# Patient Record
Sex: Female | Born: 1950 | Race: White | Hispanic: No | Marital: Single | Smoking: Never smoker
Health system: Southern US, Community
[De-identification: ages and names within clinical notes are randomized; demographics above are authoritative.]

## PROBLEM LIST (undated history)

## (undated) DIAGNOSIS — H539 Unspecified visual disturbance: Secondary | ICD-10-CM

## (undated) DIAGNOSIS — K219 Gastro-esophageal reflux disease without esophagitis: Secondary | ICD-10-CM

## (undated) DIAGNOSIS — E119 Type 2 diabetes mellitus without complications: Secondary | ICD-10-CM

## (undated) HISTORY — DX: Gastro-esophageal reflux disease without esophagitis: K21.9

## (undated) HISTORY — DX: Type 2 diabetes mellitus without complications: E11.9

## (undated) HISTORY — DX: Unspecified visual disturbance: H53.9

---

## 1994-11-04 ENCOUNTER — Ambulatory Visit: Admit: 1994-11-04 | Disposition: A | Discharge status: Home / Self Care | Payer: Self-pay | Source: Ambulatory Visit

## 1999-08-15 ENCOUNTER — Ambulatory Visit
Admit: 1999-08-15 | Disposition: A | Discharge status: Home / Self Care | Payer: Self-pay | Source: Ambulatory Visit | Admitting: Obstetrics & Gynecology

## 1999-10-22 ENCOUNTER — Ambulatory Visit: Admission: RE | Admit: 1999-10-22 | Payer: Self-pay | Source: Ambulatory Visit | Admitting: Gastroenterology

## 2001-04-08 HISTORY — PX: KNEE SURGERY: SHX244

## 2001-10-08 ENCOUNTER — Ambulatory Visit
Admit: 2001-10-08 | Disposition: A | Discharge status: Home / Self Care | Payer: Self-pay | Source: Ambulatory Visit | Admitting: "Endocrinology

## 2002-01-11 ENCOUNTER — Ambulatory Visit
Admit: 2002-01-11 | Disposition: A | Discharge status: Home / Self Care | Payer: Self-pay | Source: Ambulatory Visit | Admitting: "Endocrinology

## 2012-04-03 ENCOUNTER — Other Ambulatory Visit: Payer: Self-pay

## 2012-04-04 ENCOUNTER — Ambulatory Visit: Payer: Commercial Managed Care - POS

## 2012-04-04 DIAGNOSIS — S99919A Unspecified injury of unspecified ankle, initial encounter: Secondary | ICD-10-CM | POA: Insufficient documentation

## 2012-04-04 DIAGNOSIS — S8990XA Unspecified injury of unspecified lower leg, initial encounter: Secondary | ICD-10-CM | POA: Insufficient documentation

## 2012-04-04 DIAGNOSIS — M25569 Pain in unspecified knee: Secondary | ICD-10-CM | POA: Insufficient documentation

## 2012-04-04 DIAGNOSIS — M25561 Pain in right knee: Secondary | ICD-10-CM

## 2012-04-04 DIAGNOSIS — M25469 Effusion, unspecified knee: Secondary | ICD-10-CM | POA: Insufficient documentation

## 2012-04-04 DIAGNOSIS — M2240 Chondromalacia patellae, unspecified knee: Secondary | ICD-10-CM | POA: Insufficient documentation

## 2012-04-04 DIAGNOSIS — W19XXXA Unspecified fall, initial encounter: Secondary | ICD-10-CM | POA: Insufficient documentation

## 2014-10-05 ENCOUNTER — Encounter (INDEPENDENT_AMBULATORY_CARE_PROVIDER_SITE_OTHER): Payer: Self-pay | Admitting: Neurology

## 2014-10-05 ENCOUNTER — Ambulatory Visit (INDEPENDENT_AMBULATORY_CARE_PROVIDER_SITE_OTHER): Payer: Commercial Managed Care - POS | Admitting: Neurology

## 2014-10-05 ENCOUNTER — Ambulatory Visit (INDEPENDENT_AMBULATORY_CARE_PROVIDER_SITE_OTHER): Payer: BLUE CROSS/BLUE SHIELD | Admitting: Neurology

## 2014-10-05 VITALS — BP 135/83 | HR 101 | Ht 63.5 in | Wt 143.0 lb

## 2014-10-05 DIAGNOSIS — S060X0A Concussion without loss of consciousness, initial encounter: Secondary | ICD-10-CM

## 2014-10-05 DIAGNOSIS — G44309 Post-traumatic headache, unspecified, not intractable: Secondary | ICD-10-CM

## 2014-10-05 DIAGNOSIS — G4700 Insomnia, unspecified: Secondary | ICD-10-CM

## 2014-10-05 MED ORDER — AMITRIPTYLINE HCL 10 MG PO TABS
20.0000 mg | ORAL_TABLET | Freq: Every evening | ORAL | Status: DC
Start: 2014-10-05 — End: 2014-12-13

## 2014-10-05 NOTE — Progress Notes (Signed)
Subjective:   Chief Complaint: Concussion     HPI:  Ruth [name] is a left handed 64 y.o. female with history of diabetes, mild diabetic neuropathy in feet, HTN, anxiety/depression on effexor for a couple years, and insomnia, who presents as a new patient for concussion.     About a month ago, late at night, she was trying to change a light bulb when she lost her balance and fell and landed on her right knee and hit her head on the dresser. This resulted in a laceration to her forehead which bled. She cleaned up her forehead and didn't think too much of her fall, so she went to sleep afterwards.     The next day she went to an urgent care after she started to develop dizziness, blurry vision, photophobia, and photophobia. She had her knee xrayed, which was reportedly normal. No imaging of her head or neck was done.     For 5 days, the above symptoms were very prominent. She couldn't sleep at all for 3 days/nights as well. She also felt that her mood was affected, possible depressive mood. She also endorsed 3-4 days of intention tremor.     Many of these symptoms improved, but she continues to have a constant headache, holocephalic. Describes her headache as a pressure sensation, but it is not very painful. 6/10. She also has intermittent pains on either side of her head which are more sharp in nature. Sometimes these sharp pains last a few minutes. No associated nausea. She has less photophobia these days. No neck pain. No associated focal numbness/tingling.  No major history of headaches in the past. No history of concussion previously. She still has blurry vision. She feels her distance vision is a little blurry.  No focal weakness. Dizziness is almost gone.    She has been taking aleve every other day for headaches.    She has had insomnia for 12 years. Sleep study in past reportedly normal. Tried OTC medications. She has been keeping good sleep hygiene. Reducing alcohol and caffeine intake. Tried lunesta  in the past. Sometimes helped. Sleepy during the day.     In other ROS, she also reports that her heart rate can be as high as 126 times, but she has no symptoms associated with it.      Review of Systems   Eyes: Positive for blurred vision and photophobia.   Musculoskeletal: Positive for joint pain.   Neurological: Positive for dizziness and headaches.   Psychiatric/Behavioral: Positive for depression. The patient is nervous/anxious.    All other systems reviewed and are negative.      The following portions of the patient's history were reviewed and updated as appropriate: allergies, current medications, past family history, past medical history, past social history, past surgical history and problem list.       Allergies   Allergen Reactions   . Codeine    . Ciprofloxacin Rash       Current Outpatient Prescriptions   Medication Sig Dispense Refill   . Dulaglutide (TRULICITY SC) Inject into the skin once a week.     Marland Kitchen glimepiride (AMARYL) 4 MG tablet Take 4 mg by mouth 2 (two) times daily.     . Insulin Degludec 100 UNIT/ML Solution Pen-injector Inject into the skin daily.     Marland Kitchen losartan (COZAAR) 100 MG tablet Take 100 mg by mouth daily.     . metFORMIN (GLUCOPHAGE) 1000 MG tablet Take 1,000 mg by mouth 2 (two)  times daily with meals.     . venlafaxine (EFFEXOR) 75 MG tablet Take 250 mg by mouth daily.     Marland Kitchen amitriptyline (ELAVIL) 10 MG tablet Take 2 tablets (20 mg total) by mouth nightly. 60 tablet 2     No current facility-administered medications for this visit.       There is no problem list on file for this patient.      History   Substance Use Topics   . Smoking status: Never Smoker    . Smokeless tobacco: Never Used   . Alcohol Use: Not on file       No family history on file.    No past surgical history on file.    Objective:   GENERAL EXAM:  Filed Vitals:    10/05/14 1245   BP: 135/83   Pulse: 101      Physical Exam   Constitutional: She is well-developed, well-nourished, and in no distress.   HENT:    Head: Normocephalic and atraumatic.   Eyes: Conjunctivae and EOM are normal. Pupils are equal, round, and reactive to light.   Neck: Normal range of motion. Neck supple.   Cardiovascular: Normal rate, regular rhythm and normal heart sounds.    Vitals reviewed.    I examined the fundi and found no abnormality. I heard no carotid bruits.     NEUROLOGIC EXAM:     Mental Status: The patient is awake, alert and oriented to person, [place], and time. Affect is normal  Fund of knowledge appropriate  3/3 immediate recall intact. 2/3 delayed recall.   Attention span and concentration appear normal. WORLD backwards intact.   No dysarthria. Naming intact, repetition intact, fluent language.    Cranial nerves:   -CN II: Visual fields full to bedside confrontation   -CN III, IV, VI: Pupils equal, round, and reactive to light; extraocular movements intact; no ptosis   -CN V: Facial sensation intact in V1 through V3 distributions   -CN VII: Face symmetric  -CN VIII: Hearing intact to conversational speech   -CN IX, X: Palate elevates symmetrically; normal phonation   -CN XI: Symmetric full strength of sternocleidomastoid and trapezius muscles   -CN XII: Tongue protrudes midline    Motor: Muscle tone normal. No atrophy, No fasiculations.   Strength R / L      R / L  Deltoid  5 / 5   Hip Flexion        5 / 5  Triceps  5 / 5   Knee flexion      5 / 5  Biceps  5 / 5   Knee ext          5 / 5  Wrist ext  5 / 5  Dorsiflexion     5 / 5   Wrist flexion  5 / 5  Plantar flexion  5 / 5   FF   5 / 5       Sensory:   Light touch intact  Temperature intact  Neglect absent    Reflexes: R / L     R / L  Biceps  2 / 2   Patellar 2 / 2  Triceps  2 / 2   Ankles  2 / 2  BR                   2 / 2   Babinksi absent    Coordination: No ataxia. No tremors.  Gait and Romberg are normal. The patient is able to stand on heels and toes without difficulty. Some difficulty with tandem.     ------    LABS:  None to review    ------    IMAGES:  None to  review    Assessment:      1. Fall  2. Head injury  3. Mild concussion  4. Insomnia  5. Headaches    Ms. Orser is a 64 year old woman with history of diabetes, mild diabetic neuropathy in feet, HTN, anxiety/depression on effexor for a couple years, and insomnia who presents as a new patient for fall 1 month ago, resulting in head injury.  She had a mechanical fall resulting in what is likely a mild concussion and postconcussive headache.  She has no significant history of headaches.  Due to the constant nature of her headaches with some associated imbalance, I would recommend neuroimaging at this time to rule out any intracranial hemorrhage that she might of had at the time of her fall or any other structural abnormalities, which may be contributing to her ongoing headaches.    Plan:     1. Concussion, without loss of consciousness, initial encounter  MRI Brain W WO Contrast    If she continues to have imbalance, consider vestibular therapy in the future.     2. Post-traumatic headache, not intractable, unspecified chronicity pattern  amitriptyline (ELAVIL) 10 MG tablet QHS. Titrate up to 20 mg as tolerated.    MRI Brain W WO Contrast   3. Insomnia, unspecified type  Will address further at her next appointment. Consider repeat sleep study and MLST. Amitriptyline may help with sleep.  Continue to maintain good sleep hygiene.       Follow up: 4 weeks and as needed.    Nikki Dom, MD  [facility]  [facility]  T [phone] F [phone]

## 2014-10-05 NOTE — Patient Instructions (Signed)
1. Start amitriptyline 10 mg every night for your headaches. If you have no side effects after one week and no improvement in your headaches, increase the dose to 20 mg every night.   2. Please schedule your MRI brain.

## 2014-10-06 ENCOUNTER — Encounter (INDEPENDENT_AMBULATORY_CARE_PROVIDER_SITE_OTHER): Payer: Self-pay

## 2014-10-18 ENCOUNTER — Encounter (INDEPENDENT_AMBULATORY_CARE_PROVIDER_SITE_OTHER): Payer: Self-pay | Admitting: Neurology

## 2014-10-24 ENCOUNTER — Encounter (INDEPENDENT_AMBULATORY_CARE_PROVIDER_SITE_OTHER): Payer: Self-pay | Admitting: Neurology

## 2014-10-24 ENCOUNTER — Ambulatory Visit (INDEPENDENT_AMBULATORY_CARE_PROVIDER_SITE_OTHER): Payer: BLUE CROSS/BLUE SHIELD | Admitting: Neurology

## 2014-10-24 VITALS — BP 114/76 | HR 105 | Ht 63.5 in | Wt 143.0 lb

## 2014-10-24 DIAGNOSIS — G4700 Insomnia, unspecified: Secondary | ICD-10-CM

## 2014-10-24 DIAGNOSIS — D329 Benign neoplasm of meninges, unspecified: Secondary | ICD-10-CM

## 2014-10-24 DIAGNOSIS — G44309 Post-traumatic headache, unspecified, not intractable: Secondary | ICD-10-CM

## 2014-10-24 DIAGNOSIS — S060X0D Concussion without loss of consciousness, subsequent encounter: Secondary | ICD-10-CM

## 2014-10-24 NOTE — Patient Instructions (Signed)
1. Please follow up with your primary doctor to consider adjusting your blood pressure medication to see if this is contributing to your dizziness. Possible orthostatic hypotension.

## 2014-10-24 NOTE — Progress Notes (Signed)
Subjective:   Chief Complaint: Follow up, last seen 6/29 for concussion.    HPI:  Ruth [name] is a 64 y.o. female with history of diabetes, mild diabetic neuropathy in feet, HTN, anxiety/depression on effexor for a couple years, and insomnia, who presents as a follow up for concussion.     She has been home for the past few weeks resting. Plans to start a new job in August.     Since her last visit, she had a MRI of her brain which showed no acute findings.  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  It also showed an incidental small focus of extra-axial enhancement in the right frontal region suspicious for a small meningioma.    She started amitriptyline at a low dose for her headaches. 10 mg has helped her sleep, but she continues to have a dull constant headaches. However, her headache is less severe. Worse at the end of the day or worse after reading or when she is at the computer for a period of time. 3/10 in severity. Sleep is better. Sleeps 6 hours a night as opposed to 3 hours previously. 20 mg QHS made her too groggy. Mood unchanged.     Dizziness is not changed. Provoked by changes in position mainly. No associated nausea or other focal neurological symptoms. She states that she had some dizziness even before her head injury which was also positional, but it was less severe. She thinks she started to have dizziness with starting a specific blood pressure medication in the past. Her blood pressures have also been much lower than previously. She states that she used to live in the 170s systolic, but now she is in the 110's all the time.     She denies any new neurological symptoms today.    To review her initial presentation on 6/29: About a month ago, late at night, she was trying to change a light bulb when she lost her balance and fell and landed on her right knee and hit her head on the dresser. This resulted in a laceration to her forehead  which bled. She cleaned up her forehead and didn't think too much of her fall, so she went to sleep afterwards.     The next day she went to an urgent care after she started to develop dizziness, blurry vision, photophobia, and photophobia. She had her knee xrayed, which was reportedly normal. No imaging of her head or neck was done.     For 5 days, the above symptoms were very prominent. She couldn't sleep at all for 3 days/nights as well. She also felt that her mood was affected, possible depressive mood. She also endorsed 3-4 days of intention tremor.     Many of these symptoms improved, but she continues to have a constant headache, holocephalic. Describes her headache as a pressure sensation, holocephalic, but it is not very painful. 6/10. She also has intermittent pains on either side of her head which are more sharp in nature. Sometimes these sharp pains last a few minutes. No associated nausea. She has less photophobia these days. No neck pain. No associated focal numbness/tingling. No major history of headaches in the past. No history of concussion previously. She still has blurry vision. She feels her distance vision is a little blurry.  No focal weakness. Dizziness is almost gone.    She has been taking aleve every other day for headaches.    She has had insomnia  for 12 years. Sleep study in past reportedly normal. Tried OTC medications. She has been keeping good sleep hygiene. Reducing alcohol and caffeine intake. Tried lunesta in the past. Sometimes helped. Sleepy during the day. Sleep study done in the past reportedly was normal.    In other ROS, she also reports that her heart rate can be as high as 126 times, but she has no symptoms associated with it.    Review of Systems   Neurological: Positive for dizziness and headaches.   Psychiatric/Behavioral: The patient has insomnia.    All other systems reviewed and are negative.      The following portions of the patient's history were reviewed and  updated as appropriate: allergies, current medications, past family history, past medical history, past social history, past surgical history and problem list.       Allergies   Allergen Reactions   . Codeine    . Ciprofloxacin Rash       Current Outpatient Prescriptions   Medication Sig Dispense Refill   . amitriptyline (ELAVIL) 10 MG tablet Take 2 tablets (20 mg total) by mouth nightly. 60 tablet 2   . Dulaglutide (TRULICITY SC) Inject into the skin once a week.     Marland Kitchen glimepiride (AMARYL) 4 MG tablet Take 4 mg by mouth 2 (two) times daily.     . Insulin Degludec 100 UNIT/ML Solution Pen-injector Inject into the skin daily.     Marland Kitchen losartan (COZAAR) 100 MG tablet Take 100 mg by mouth daily.     . metFORMIN (GLUCOPHAGE) 1000 MG tablet Take 1,000 mg by mouth 2 (two) times daily with meals.     . venlafaxine (EFFEXOR) 75 MG tablet Take 250 mg by mouth daily.       No current facility-administered medications for this visit.       There is no problem list on file for this patient.    Objective:   GENERAL EXAM:  Filed Vitals:    10/24/14 1239   BP: 114/76   Pulse: 105      Physical Exam   Constitutional: She is well-developed, well-nourished, and in no distress.   HENT:   Head: Normocephalic and atraumatic.   Eyes: Conjunctivae and EOM are normal. Pupils are equal, round, and reactive to light.   Vitals reviewed.    NEUROLOGIC EXAM:     Mental Status: The patient is awake, alert Affect is normal  Fund of knowledge appropriate  Attention span and concentration appear normal.  No dysarthria. Language fluent in conversation.    Cranial nerves:   -CN II: Visual fields full to bedside confrontation   -CN III, IV, VI: Pupils equal, round, and reactive to light; extraocular movements intact; no ptosis   -CN V: Facial sensation intact in V1 through V3 distributions   -CN VII: Face symmetric  -CN VIII: Hearing intact to conversational speech     Motor: Muscle tone normal. Full strength throughout.     Sensory:    Light touch intact  Neglect absent    Coordination: No ataxia. No tremors.     Gait and Romberg are normal. Still has some difficulty with tandem, but improved a little.   ------    IMAGES:    MRI brain without contrast October 13, 2014:  Impression:   1. 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    2.  Small focus of extra-axial enhancement in the  right frontal region suspicious for a small meningioma.  Follow-up is recommended.    Assessment:      1. Fall  2. Head injury  3. Mild concussion  4. Insomnia, improved  5. Headaches, mildly improved  6. Dizziness, unchanged    Ms. Holtzclaw is a 64 year old woman with history of diabetes, mild diabetic neuropathy in feet, HTN, anxiety/depression on effexor for a couple years, and insomnia who presents as follow up today after a fall in May, resulting in head injury.  She had a mechanical fall resulting in what is likely a mild concussion and postconcussive headache.  She has no significant history of headaches.  Due to the constant nature of her headaches with some associated imbalance, I recommended neuroimaging which showed no hemorrhage or stroke. It showed an incidental finding of a right frontal meningioma, small in size. Her headaches are mildly improved on low dose amitriptyline. I would like to continue her on this dose due to side effects at higher doses noted. Due to ongoing positional dizziness, I recommend that she start vestibular therapy. She also notes a preceding history of dizziness, which could suggest orthostatic hypotension. I recommend that she follow up with her primary doctor for further evaluation and possible adjustment of her antihypertensive medications.      Plan:     1.  Concussion, without loss of consciousness, initial encounter   Start vestibular therapy for balance exercises     2.  Post-traumatic headache, not intractable, unspecified chronicity pattern    Amitriptyline (ELAVIL) 10 MG tablet QHS.   Consider switching to gabapentin if headaches worsen.   NSAIDS prn.    3.  Insomnia, unspecified type   Consider repeat sleep study and MLST when nortriptyline discontinued. She is currently sleeping better on amitriptyline.  Continue to maintain good sleep hygiene.    4. Incidental meningioma  Repeat MRI brain w wo contrast in 6 months (Jan 2017)                     Follow up: 4 weeks and as needed.    Nikki Dom, MD  [facility]  [facility]  T [phone] F [phone]

## 2014-11-03 ENCOUNTER — Ambulatory Visit (INDEPENDENT_AMBULATORY_CARE_PROVIDER_SITE_OTHER): Payer: BLUE CROSS/BLUE SHIELD | Admitting: Neurology

## 2014-11-18 ENCOUNTER — Encounter (INDEPENDENT_AMBULATORY_CARE_PROVIDER_SITE_OTHER): Payer: Self-pay

## 2014-11-18 NOTE — Progress Notes (Signed)
Pt states she was informed by Dr. Selena Batten to repeat MRI in 6 mos.

## 2014-11-24 ENCOUNTER — Ambulatory Visit (INDEPENDENT_AMBULATORY_CARE_PROVIDER_SITE_OTHER): Payer: BLUE CROSS/BLUE SHIELD | Admitting: Neurology

## 2014-12-13 ENCOUNTER — Ambulatory Visit (INDEPENDENT_AMBULATORY_CARE_PROVIDER_SITE_OTHER): Payer: BLUE CROSS/BLUE SHIELD | Admitting: Neurology

## 2014-12-13 VITALS — BP 152/92 | HR 93 | Ht 62.5 in | Wt 144.8 lb

## 2014-12-13 DIAGNOSIS — G4700 Insomnia, unspecified: Secondary | ICD-10-CM

## 2014-12-13 DIAGNOSIS — D329 Benign neoplasm of meninges, unspecified: Secondary | ICD-10-CM

## 2014-12-13 DIAGNOSIS — R400 Somnolence: Secondary | ICD-10-CM

## 2014-12-13 DIAGNOSIS — G44309 Post-traumatic headache, unspecified, not intractable: Secondary | ICD-10-CM

## 2014-12-13 DIAGNOSIS — S060X0D Concussion without loss of consciousness, subsequent encounter: Secondary | ICD-10-CM

## 2014-12-13 NOTE — Progress Notes (Signed)
Subjective:   Chief Complaint: Follow up, last seen 10/24/14 for concussion    HPI:  Ruth [name] is a left handed 64 y.o. female with history of diabetes, mild diabetic neuropathy in feet, HTN, anxiety/depression on effexor for a couple years, and insomnia, who presents as a follow up for concussion.     At her last visit, she noted a preceding history of dizziness, which could suggest a component of orthostatic hypotension. I recommended that she follow up with her primary doctor for further evaluation and possible adjustment of her antihypertensive medications.  She states that she instead weaned herself off her blood pressure medications herself and feels that her dizziness is almost gone. She reports that her blood pressures had been well controlled at home. However, although her dizziness is almost all gone, she still has trouble keeping her balance. Not any worse than last visit.  I recommended vestibular therapy at her last visit, however, she has not started this yet.    She now notes having random twitching anywhere in her body since her concussion, but more pronounced now. Lasts a second each time. Not all the time. No associated change in mentation. Sometimes in sleep as well.   She also notes that her handwriting is worse, since her concussion.   No cognitive changes. Not as active as before, sleeps for 5 hours a night which is a little better than previously. Still on lunesta.   Headaches are almost gone. None of any significance. Sometimes with barometric changes, she has a mild headache.   She brings in a disc of her MRI.   ....      To review her initial presentation on 6/29: About a month ago, late at night, she was trying to change a light bulb when she lost her balance and fell and landed on her right knee and hit her head on the dresser. This resulted in a laceration to her forehead which bled. She cleaned up her forehead and didn't think too much of her fall, so she went to sleep  afterwards.     The next day she went to an urgent care after she started to develop dizziness, blurry vision, photophobia, and photophobia. She had her knee xrayed, which was reportedly normal. No imaging of her head or neck was done.     For 5 days, the above symptoms were very prominent. She couldn't sleep at all for 3 days/nights as well. She also felt that her mood was affected, possible depressive mood. She also endorsed 3-4 days of intention tremor.     Many of these symptoms improved, but she continues to have a constant headache, holocephalic. Describes her headache as a pressure sensation, holocephalic, but it is not very painful. 6/10. She also has intermittent pains on either side of her head which are more sharp in nature. Sometimes these sharp pains last a few minutes. No associated nausea. She has less photophobia these days. No neck pain. No associated focal numbness/tingling. No major history of headaches in the past. No history of concussion previously. She still has blurry vision. She feels her distance vision is a little blurry.  No focal weakness. Dizziness is almost gone.    She has been taking aleve every other day for headaches.    She has had insomnia for 12 years. Sleep study in past reportedly normal. Tried OTC medications. She has been keeping good sleep hygiene. Reducing alcohol and caffeine intake. Tried lunesta in the past. Sometimes helped. Sleepy  during the day. Sleep study done in the past reportedly was normal.    10/24/14: She has been home for the past few weeks resting. Plans to start a new job in August.   Since her last visit, she had a MRI of her brain which showed no acute findings.  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  It also showed an incidental small focus of extra-axial enhancement in the right frontal region suspicious for a small meningioma.  She started amitriptyline at a low dose for her headaches. 10 mg has  helped her sleep, but she continues to have a dull constant headaches. However, her headache is less severe. Worse at the end of the day or worse after reading or when she is at the computer for a period of time. 3/10 in severity. Sleep is better. Sleeps 6 hours a night as opposed to 3 hours previously. 20 mg QHS made her too groggy. Mood unchanged.   Dizziness is not changed. Provoked by changes in position mainly. No associated nausea or other focal neurological symptoms. She states that she had some dizziness even before her head injury which was also positional, but it was less severe. She thinks she started to have dizziness with starting a specific blood pressure medication in the past. Her blood pressures have also been much lower than previously. She states that she used to live in the 170s systolic, but now she is in the 110's all the time.      Review of Systems   Neurological: Dizziness: imbalance. Tremors: twitching.   Psychiatric/Behavioral: The patient has insomnia.    All other systems reviewed and are negative.      The following portions of the patient's history were reviewed and updated as appropriate: allergies, current medications, past family history, past medical history, past social history, past surgical history and problem list.       Allergies   Allergen Reactions   . Codeine    . Ciprofloxacin Rash       Current Outpatient Prescriptions   Medication Sig Dispense Refill   . Dulaglutide (TRULICITY SC) Inject into the skin once a week.     Marland Kitchen glimepiride (AMARYL) 4 MG tablet Take 4 mg by mouth 2 (two) times daily.     . Insulin Degludec 100 UNIT/ML Solution Pen-injector Inject into the skin daily.     . metFORMIN (GLUCOPHAGE) 1000 MG tablet Take 1,000 mg by mouth 2 (two) times daily with meals.     . venlafaxine (EFFEXOR) 75 MG tablet Take 250 mg by mouth daily.       No current facility-administered medications for this visit.     Objective:   GENERAL EXAM:  Filed Vitals:    12/13/14 1231    BP: 152/92   Pulse: 93      Physical Exam   Constitutional: She is well-developed, well-nourished, and in no distress.   HENT:   Head: Normocephalic and atraumatic.   Eyes: Conjunctivae and EOM are normal. Pupils are equal, round, and reactive to light.   Vitals reviewed.    NEUROLOGIC EXAM:     Mental Status: The patient is awake, alert Affect is normal  Fund of knowledge appropriate  Attention span and concentration appear normal.  No dysarthria. Language fluent in conversation.    Cranial nerves:   -CN II: Visual fields full to bedside confrontation   -CN III, IV, VI: Pupils equal, round, and reactive to light; extraocular movements intact;  no ptosis   -CN V: Facial sensation intact in V1 through V3 distributions   -CN VII: Face symmetric  -CN VIII: Hearing intact to conversational speech     Motor: Muscle tone normal. Full strength throughout.     Sensory:   Light touch intact  Neglect absent    Coordination: No ataxia. No tremors.     Gait and Romberg are normal. Still has some difficulty with tandem, but improved a little.     Assessment:      1. Fall  2. Head injury, May 2016  3. Mild concussion, improving  4. Insomnia, improved but still only sleeping 5 hours a night  5. Headaches, improved  6. Dizziness, improved  7. Imbalance, unchanged  8. Body twitching    Ms. Swofford is a 64 year old woman with history of diabetes, mild diabetic neuropathy in feet, HTN, anxiety/depression on effexor for a couple years, and insomnia who presents as follow up today after a fall in May, resulting in head injury.  She had a mechanical fall resulting in what is likely a mild concussion and postconcussive headache.  She has no significant history of headaches.  Due to the constant nature of her headaches with some associated imbalance, I recommended neuroimaging which showed no hemorrhage or stroke. It showed an incidental finding of a right frontal meningioma, small in size.     Today, she states that her  headaches and dizziness are almost gone. She has persistent imbalance however, as well as random body twitching and worsened handwriting. I do not observe any involuntary movements or fasciculations on her exam today. It is unclear what these are, but they started after her concussion. We went over her MRI today. I do not believe that her small right frontal meningioma could be contributing to her twitching or handwriting difficulty. It may be related to her insomnia and possible sleeping disorder. Symptomatology not consistent with seizures.     Plan:     1. Imbalance Start vestibular therapy for balance exercises      2. Post-traumatic headache, not intractable, unspecified chronicity pattern  Discontinue amitriptyline. Continue NSAIDs PRN.   3. Insomnia, unspecified type  General sleep study    PR MLT SLEEP LATENCY/MAINT OF WAKEFULNESS TSTG   4. Meningioma  Repeat MRI brain w wo contrast in 6 months (Jan 2017)        Follow up: 2 months and as needed.    Nikki Dom, MD  [facility]  [facility]  T [phone] F [phone]

## 2014-12-13 NOTE — Patient Instructions (Addendum)
REMINDERS FROM TODAY'S VISIT:     Tests:  - Please schedule and sleep study and MLST study for your insomnia and daytime sleepiness  - Please schedule a MRI brain to be repeated in January 2017    Medications:  - Discontinue amitriptyline.     Other Instructions:  - Please schedule to start vestibular therapy for your imbalance.   - Please schedule a follow up appointment with me at least one week after your tests are completed in order for [place] to have enough time to receive and review your test results.     HOW TO CONTACT ME:     Please sign up for MyChart. This is an easy way to send me questions or messages online. This is the best way to contact me for non-emergency questions. I try my best to respond to all messages within 48-72 hours.  You can always call our office staff as well if you would like to speak to my nurse Ashten who can also relay messages to me.      However, if you are having a medical emergency, please call 911.

## 2015-01-05 ENCOUNTER — Encounter (INDEPENDENT_AMBULATORY_CARE_PROVIDER_SITE_OTHER): Payer: Self-pay | Admitting: Family

## 2015-01-05 ENCOUNTER — Ambulatory Visit (INDEPENDENT_AMBULATORY_CARE_PROVIDER_SITE_OTHER): Payer: BLUE CROSS/BLUE SHIELD | Admitting: Family

## 2015-01-05 VITALS — BP 157/95 | HR 87 | Temp 97.2°F | Resp 16 | Ht 63.0 in | Wt 142.0 lb

## 2015-01-05 DIAGNOSIS — J0100 Acute maxillary sinusitis, unspecified: Secondary | ICD-10-CM

## 2015-01-05 NOTE — Progress Notes (Signed)
Chief Complaint   Patient presents with   . Sinus Problem     patient has sinus infection. it has been on since 2 months.   no recent travel  No fever  Sinus congestion  No known sick contacts   No allergies   pcp went concierge so pt hasnt been able to find new dr yet   Using otc meds with some relief      Denies headache   Denies dizziness     HEENT WNL  Lungs - CTA  Cards- s1s2 no murmurs   Lymph - no adenopathy     Due to length time symptoms have persisted   Will start on abx   Written rx provided   List of pcp provided       See scanned forms

## 2015-01-07 ENCOUNTER — Encounter (INDEPENDENT_AMBULATORY_CARE_PROVIDER_SITE_OTHER): Payer: Self-pay | Admitting: Family

## 2015-01-24 ENCOUNTER — Other Ambulatory Visit (INDEPENDENT_AMBULATORY_CARE_PROVIDER_SITE_OTHER): Payer: Self-pay | Admitting: Neurology

## 2015-04-01 ENCOUNTER — Other Ambulatory Visit: Payer: Self-pay | Admitting: Neurology

## 2015-04-05 ENCOUNTER — Encounter (INDEPENDENT_AMBULATORY_CARE_PROVIDER_SITE_OTHER): Payer: Self-pay | Admitting: Neurology

## 2015-04-12 ENCOUNTER — Ambulatory Visit (INDEPENDENT_AMBULATORY_CARE_PROVIDER_SITE_OTHER): Payer: BLUE CROSS/BLUE SHIELD | Admitting: Neurology

## 2015-04-28 ENCOUNTER — Encounter (INDEPENDENT_AMBULATORY_CARE_PROVIDER_SITE_OTHER): Payer: Self-pay

## 2015-04-28 NOTE — Progress Notes (Signed)
No results of sleep study on file, called patient to see if she had it done but there was no answer. Left voicemail and callback number.

## 2015-05-02 ENCOUNTER — Ambulatory Visit (INDEPENDENT_AMBULATORY_CARE_PROVIDER_SITE_OTHER): Payer: BLUE CROSS/BLUE SHIELD | Admitting: Neurology

## 2015-05-03 ENCOUNTER — Encounter (INDEPENDENT_AMBULATORY_CARE_PROVIDER_SITE_OTHER): Payer: Self-pay

## 2015-05-03 NOTE — Progress Notes (Signed)
MRI results mailed to address on file per patient request.

## 2015-07-24 ENCOUNTER — Other Ambulatory Visit: Payer: Self-pay | Admitting: Internal Medicine

## 2015-07-24 DIAGNOSIS — M549 Dorsalgia, unspecified: Secondary | ICD-10-CM

## 2015-07-25 ENCOUNTER — Ambulatory Visit: Payer: BLUE CROSS/BLUE SHIELD | Attending: Internal Medicine

## 2015-07-25 DIAGNOSIS — M4802 Spinal stenosis, cervical region: Secondary | ICD-10-CM | POA: Insufficient documentation

## 2015-07-25 DIAGNOSIS — M549 Dorsalgia, unspecified: Secondary | ICD-10-CM

## 2015-07-25 DIAGNOSIS — M5410 Radiculopathy, site unspecified: Secondary | ICD-10-CM | POA: Insufficient documentation

## 2018-08-06 ENCOUNTER — Observation Stay
Admission: EM | Admit: 2018-08-06 | Discharge: 2018-08-08 | Disposition: A | Discharge status: Home / Self Care | Payer: BLUE CROSS/BLUE SHIELD | Attending: Internal Medicine | Admitting: Internal Medicine

## 2018-08-06 ENCOUNTER — Emergency Department: Payer: BLUE CROSS/BLUE SHIELD

## 2018-08-06 DIAGNOSIS — E860 Dehydration: Secondary | ICD-10-CM | POA: Insufficient documentation

## 2018-08-06 DIAGNOSIS — K812 Acute cholecystitis with chronic cholecystitis: Principal | ICD-10-CM | POA: Insufficient documentation

## 2018-08-06 DIAGNOSIS — E119 Type 2 diabetes mellitus without complications: Secondary | ICD-10-CM | POA: Insufficient documentation

## 2018-08-06 DIAGNOSIS — N390 Urinary tract infection, site not specified: Secondary | ICD-10-CM | POA: Insufficient documentation

## 2018-08-06 DIAGNOSIS — K819 Cholecystitis, unspecified: Secondary | ICD-10-CM | POA: Diagnosis present

## 2018-08-06 DIAGNOSIS — Z03818 Encounter for observation for suspected exposure to other biological agents ruled out: Secondary | ICD-10-CM | POA: Insufficient documentation

## 2018-08-06 DIAGNOSIS — Z794 Long term (current) use of insulin: Secondary | ICD-10-CM | POA: Insufficient documentation

## 2018-08-06 DIAGNOSIS — F419 Anxiety disorder, unspecified: Secondary | ICD-10-CM | POA: Diagnosis present

## 2018-08-06 DIAGNOSIS — K760 Fatty (change of) liver, not elsewhere classified: Secondary | ICD-10-CM | POA: Insufficient documentation

## 2018-08-06 DIAGNOSIS — K810 Acute cholecystitis: Secondary | ICD-10-CM

## 2018-08-06 DIAGNOSIS — K219 Gastro-esophageal reflux disease without esophagitis: Secondary | ICD-10-CM | POA: Insufficient documentation

## 2018-08-06 DIAGNOSIS — R1011 Right upper quadrant pain: Secondary | ICD-10-CM

## 2018-08-06 DIAGNOSIS — R590 Localized enlarged lymph nodes: Secondary | ICD-10-CM | POA: Insufficient documentation

## 2018-08-06 LAB — COMPREHENSIVE METABOLIC PANEL
ALT: 24 U/L (ref 0–55)
AST (SGOT): 20 U/L (ref 5–34)
Albumin/Globulin Ratio: 1.2 (ref 0.9–2.2)
Albumin: 4.2 g/dL (ref 3.5–5.0)
Alkaline Phosphatase: 111 U/L — ABNORMAL HIGH (ref 37–106)
Anion Gap: 10 (ref 5.0–15.0)
BUN: 17 mg/dL (ref 7.0–19.0)
Bilirubin, Total: 0.4 mg/dL (ref 0.2–1.2)
CO2: 27 mEq/L (ref 22–29)
Calcium: 9.5 mg/dL (ref 8.5–10.5)
Chloride: 103 mEq/L (ref 100–111)
Creatinine: 0.7 mg/dL (ref 0.6–1.0)
Globulin: 3.4 g/dL (ref 2.0–3.6)
Glucose: 120 mg/dL — ABNORMAL HIGH (ref 70–100)
Potassium: 4.1 mEq/L (ref 3.5–5.1)
Protein, Total: 7.6 g/dL (ref 6.0–8.3)
Sodium: 140 mEq/L (ref 136–145)

## 2018-08-06 LAB — URINALYSIS WITH MICROSCOPIC
Bilirubin, UA: NEGATIVE
Blood, UA: NEGATIVE
Glucose, UA: NEGATIVE
Ketones UA: NEGATIVE
Nitrite, UA: POSITIVE — AB
Protein, UR: NEGATIVE
Specific Gravity UA: 1.027 (ref 1.001–1.035)
Urine pH: 5 (ref 5.0–8.0)
Urobilinogen, UA: NEGATIVE mg/dL (ref 0.2–2.0)

## 2018-08-06 LAB — CBC AND DIFFERENTIAL
Absolute NRBC: 0 10*3/uL (ref 0.00–0.00)
Basophils Absolute Automated: 0.03 10*3/uL (ref 0.00–0.08)
Basophils Automated: 0.3 %
Eosinophils Absolute Automated: 0.21 10*3/uL (ref 0.00–0.44)
Eosinophils Automated: 2.1 %
Hematocrit: 45.1 % — ABNORMAL HIGH (ref 34.7–43.7)
Hgb: 14.9 g/dL — ABNORMAL HIGH (ref 11.4–14.8)
Immature Granulocytes Absolute: 0.03 10*3/uL (ref 0.00–0.07)
Immature Granulocytes: 0.3 %
Lymphocytes Absolute Automated: 2.24 10*3/uL (ref 0.42–3.22)
Lymphocytes Automated: 22.1 %
MCH: 30.7 pg (ref 25.1–33.5)
MCHC: 33 g/dL (ref 31.5–35.8)
MCV: 93 fL (ref 78.0–96.0)
MPV: 8.2 fL — ABNORMAL LOW (ref 8.9–12.5)
Monocytes Absolute Automated: 0.71 10*3/uL (ref 0.21–0.85)
Monocytes: 7 %
Neutrophils Absolute: 6.93 10*3/uL — ABNORMAL HIGH (ref 1.10–6.33)
Neutrophils: 68.2 %
Nucleated RBC: 0 /100 WBC (ref 0.0–0.0)
Platelets: 317 10*3/uL (ref 142–346)
RBC: 4.85 10*6/uL (ref 3.90–5.10)
RDW: 13 % (ref 11–15)
WBC: 10.15 10*3/uL — ABNORMAL HIGH (ref 3.10–9.50)

## 2018-08-06 LAB — GFR: EGFR: >60

## 2018-08-06 LAB — COVID-19 (SARS-COV-2): SARS CoV 2 Overall Result: NEGATIVE

## 2018-08-06 LAB — GLUCOSE WHOLE BLOOD - POCT: Whole Blood Glucose POCT: 78 mg/dL (ref 70–100)

## 2018-08-06 LAB — LIPASE: Lipase: 27 U/L (ref 8–78)

## 2018-08-06 LAB — HEMOLYSIS INDEX: Hemolysis Index: 5 (ref 0–18)

## 2018-08-06 MED ORDER — ONDANSETRON HCL 4 MG/2ML IJ SOLN
4.00 mg | Freq: Once | INTRAMUSCULAR | Status: AC | PRN
Start: 2018-08-06 — End: 2018-08-07
  Administered 2018-08-07: 09:00:00 4 mg via INTRAVENOUS
  Filled 2018-08-06: qty 2

## 2018-08-06 MED ORDER — MELATONIN 3 MG PO TABS
3.0000 mg | ORAL_TABLET | Freq: Every evening | ORAL | Status: DC | PRN
Start: 2018-08-06 — End: 2018-08-08
  Administered 2018-08-06: 3 mg via ORAL
  Filled 2018-08-06: qty 1

## 2018-08-06 MED ORDER — DEXTROSE-NACL 5-0.45 % IV SOLN
Freq: Once | INTRAVENOUS | Status: AC
Start: 2018-08-06 — End: 2018-08-06

## 2018-08-06 MED ORDER — DEXTROSE 10 % IV SOLN
INTRAVENOUS | Status: AC
Start: 2018-08-06 — End: 2018-08-06
  Administered 2018-08-06: 17:00:00 250 mL via INTRAVENOUS
  Filled 2018-08-06: qty 250

## 2018-08-06 MED ORDER — OXYCODONE-ACETAMINOPHEN 5-325 MG PO TABS
1.0000 | ORAL_TABLET | ORAL | Status: DC | PRN
Start: 2018-08-06 — End: 2018-08-07
  Filled 2018-08-06: qty 1

## 2018-08-06 MED ORDER — DEXTROSE 250 MG/ML IV SOLN
2.00 mL/kg | Freq: Once | INTRAVENOUS | Status: DC
Start: 2018-08-06 — End: 2018-08-06

## 2018-08-06 MED ORDER — INSULIN LISPRO 100 UNIT/ML SC SOLN
1.00 [IU] | Freq: Three times a day (TID) | SUBCUTANEOUS | Status: DC
Start: 2018-08-07 — End: 2018-08-08

## 2018-08-06 MED ORDER — CEFAZOLIN SODIUM 1 G IJ SOLR
1.00 g | Freq: Three times a day (TID) | INTRAMUSCULAR | Status: DC
Start: 2018-08-06 — End: 2018-08-08
  Administered 2018-08-07 – 2018-08-08 (×5): 1 g via INTRAVENOUS
  Filled 2018-08-06 (×8): qty 1000

## 2018-08-06 MED ORDER — CEFAZOLIN SODIUM 1 G IJ SOLR
2.00 g | Freq: Once | INTRAMUSCULAR | Status: AC
Start: 2018-08-06 — End: 2018-08-06
  Administered 2018-08-06: 17:00:00 2 g via INTRAVENOUS
  Filled 2018-08-06: qty 2000

## 2018-08-06 MED ORDER — DEXTROSE 10 % IV BOLUS
250.00 mL | Freq: Once | INTRAVENOUS | Status: AC
Start: 2018-08-06 — End: 2018-08-06

## 2018-08-06 MED ORDER — ACETAMINOPHEN 325 MG PO TABS
650.00 mg | ORAL_TABLET | Freq: Once | ORAL | Status: AC | PRN
Start: 2018-08-06 — End: 2018-08-06
  Administered 2018-08-06: 650 mg via ORAL
  Filled 2018-08-06: qty 2

## 2018-08-06 MED ORDER — IOHEXOL 350 MG/ML IV SOLN
100.00 mL | Freq: Once | INTRAVENOUS | Status: AC | PRN
Start: 2018-08-06 — End: 2018-08-06
  Administered 2018-08-06: 16:00:00 100 mL via INTRAVENOUS

## 2018-08-06 MED ORDER — SODIUM CHLORIDE 0.9 % IV BOLUS
1000.0000 mL | Freq: Once | INTRAVENOUS | Status: AC
Start: 2018-08-06 — End: 2018-08-06
  Administered 2018-08-06: 1000 mL via INTRAVENOUS

## 2018-08-06 MED ORDER — SODIUM CHLORIDE 0.45 % IV SOLN
INTRAVENOUS | Status: DC
Start: 2018-08-06 — End: 2018-08-07
  Administered 2018-08-06: 23:00:00 75 mL/h via INTRAVENOUS

## 2018-08-06 NOTE — H&P (Signed)
[facility]      Patient: MARGARITA CROKE  Date: 08/06/2018   DOB: 15-Mar-1951  Admission Date: 08/06/2018   MRN: [id number]  Attending: Stokes Rattigan R Lenox Bink         Chief Complaint   Patient presents with   . Abdominal Pain x 2 days      History Gathered From: Patient    HISTORY AND PHYSICAL     BRITTINI BRUBECK is a 68 y.o. female with a PMHx of type II diabetes who presented with abd pain x 2 days in the RUQ.  Pain was moderate to severe and associated with one episode of N/V.  Had poor appetite and PO intake during this time.  Symptoms worsened without cause and she came to the ED.  No CP, fever, diaphoresis, chills, SOB, dysuria.  No prior history of similar symptoms, or prior gallstone disease.    Past Medical History:   Diagnosis Date   . Diabetes mellitus     type2       Past Surgical History:   Procedure Laterality Date   . KNEE SURGERY  2003       Prior to Admission medications    Medication Sig Start Date End Date Taking? Authorizing Provider   Dulaglutide (TRULICITY SC) Inject into the skin once a week.   Yes [provider]   Insulin Degludec 100 UNIT/ML Solution Pen-injector Inject into the skin daily.   Yes [provider]   insulin glargine 100 UNIT/ML injection pen Inject 28 Units into the skin nightly   Yes [provider]   metFORMIN (GLUCOPHAGE) 1000 MG tablet Take 1,000 mg by mouth 2 (two) times daily with meals.   Yes [provider]   venlafaxine (EFFEXOR) 75 MG tablet Take 250 mg by mouth daily.   Yes [provider]   glimepiride (AMARYL) 4 MG tablet Take 4 mg by mouth 2 (two) times daily.    [provider]       Allergies   Allergen Reactions   . Codeine Anaphylaxis     "throat closing" per patient   . Ciprofloxacin Rash       CODE STATUS: Full    PRIMARY CARE MD: Joice Lofts, MD    Family History   Problem Relation Age of Onset   . Heart disease Mother    . Hyperlipidemia Mother    . Diabetes Mother    . Diabetes Father    . Heart  disease Father    . Hyperlipidemia Father        Social History     Tobacco Use   . Smoking status: Never Smoker   . Smokeless tobacco: Never Used   Substance Use Topics   . Alcohol use: Not on file   . Drug use: Not on file     Patient is divorced and works for the [employer].  Lives alone.    REVIEW OF SYSTEMS     Negative except for that noted in the HPI    PHYSICAL EXAM     Vital Signs (most recent): BP 131/83   Pulse 83   Temp 97.9 F (36.6 C) (Oral)   Resp 18   Ht 1.575 m (5\' 2" )   Wt 65.8 kg (145 lb)   SpO2 98%   BMI 26.52 kg/m   Constitutional: Slightly ill-appearing but not in distress   HEENT: Pupils reactive bilaterally, dry oral mucosa, anicteric sclera  Neck: supple, no lymphadenopathy, no  bruits  Cardiovascular: RRR, normal S1 S2, no murmurs, gallops; no peripheral edema  Respiratory: Normal rate. No retractions or increased work of breathing. Clear to auscultation and percussion bilaterally.  Gastrointestinal: soft, non-distended, BS throughout but faint, RUQ guarding and tenderness to light palpation without rebound  Musculoskeletal: FROM all ext, no cyanosis, no clubbing  Skin exam:  Slightly hot to touch, not diaphoretic  Neurologic: Strength 5/5 all ext, no tremor, follows commands, sensation to light touch intact; AAOx3  Psychiatric: smiling, appropriate affect, TP organized    Exam done by Juelz Claar Alyson Reedy, MD on 08/06/18 at 9:14 PM      LABS & IMAGING     Recent Results (from the past 24 hour(s))   Comprehensive metabolic panel    Collection Time: 08/06/18  3:02 PM   Result Value Ref Range    Glucose 120 (H) 70 - 100 mg/dL    BUN 47.8 7.0 - 29.5 mg/dL    Creatinine 0.7 0.6 - 1.0 mg/dL    Sodium 621 308 - 657 mEq/L    Potassium 4.1 3.5 - 5.1 mEq/L    Chloride 103 100 - 111 mEq/L    CO2 27 22 - 29 mEq/L    Calcium 9.5 8.5 - 10.5 mg/dL    Protein, Total 7.6 6.0 - 8.3 g/dL    Albumin 4.2 3.5 - 5.0 g/dL    AST (SGOT) 20 5 - 34 U/L    ALT 24 0 - 55 U/L    Alkaline Phosphatase 111 (H)  37 - 106 U/L    Bilirubin, Total 0.4 0.2 - 1.2 mg/dL    Globulin 3.4 2.0 - 3.6 g/dL    Albumin/Globulin Ratio 1.2 0.9 - 2.2    Anion Gap 10.0 5.0 - 15.0   Lipase    Collection Time: 08/06/18  3:02 PM   Result Value Ref Range    Lipase 27 8 - 78 U/L   Hemolysis index    Collection Time: 08/06/18  3:02 PM   Result Value Ref Range    Hemolysis Index 5 0 - 18   GFR    Collection Time: 08/06/18  3:02 PM   Result Value Ref Range    EGFR >60.0    CBC and differential    Collection Time: 08/06/18  3:02 PM   Result Value Ref Range    WBC 10.15 (H) 3.10 - 9.50 x10 3/uL    Hgb 14.9 (H) 11.4 - 14.8 g/dL    Hematocrit 84.6 (H) 34.7 - 43.7 %    Platelets 317 142 - 346 x10 3/uL    RBC 4.85 3.90 - 5.10 x10 6/uL    MCV 93.0 78.0 - 96.0 fL    MCH 30.7 25.1 - 33.5 pg    MCHC 33.0 31.5 - 35.8 g/dL    RDW 13 11 - 15 %    MPV 8.2 (L) 8.9 - 12.5 fL    Neutrophils 68.2 None %    Lymphocytes Automated 22.1 None %    Monocytes 7.0 None %    Eosinophils Automated 2.1 None %    Basophils Automated 0.3 None %    Immature Granulocyte 0.3 None %    Nucleated RBC 0.0 0.0 - 0.0 /100 WBC    Neutrophils Absolute 6.93 (H) 1.10 - 6.33 x10 3/uL    Abs Lymph Automated 2.24 0.42 - 3.22 x10 3/uL    Abs Mono Automated 0.71 0.21 - 0.85 x10 3/uL    Abs Eos Automated 0.21 0.00 -  0.44 x10 3/uL    Absolute Baso Automated 0.03 0.00 - 0.08 x10 3/uL    Absolute Immature Granulocyte 0.03 0.00 - 0.07 x10 3/uL    Absolute NRBC 0.00 0.00 - 0.00 x10 3/uL   Urinalysis with microscopic (pts < 3 yrs)    Collection Time: 08/06/18  3:02 PM   Result Value Ref Range    Urine Type Clean Catch     Color, UA Yellow Colorless - Yellow    Clarity, UA Hazy Clear - Hazy    Specific Gravity UA 1.027 1.001 - 1.035    Urine pH 5.0 5.0 - 8.0    Leukocyte Esterase, UA Small (A) Negative    Nitrite, UA Positive (A) Negative    Protein, UR Negative Negative    Glucose, UA Negative Negative    Ketones UA Negative Negative    Urobilinogen, UA Negative 0.2 - 2.0 mg/dL    Bilirubin, UA Negative  Negative    Blood, UA Negative Negative    RBC, UA 0 - 2 0 - 5 /hpf    WBC, UA 11 - 25 (A) 0 - 5 /hpf    Squamous Epithelial Cells, Urine 0 - 5 0 - 25 /hpf    Urine Mucus Present None   COVID-19 (SARS-CoV-2) ([place] [facility])    Collection Time: 08/06/18  6:54 PM   Result Value Ref Range    SARS-CoV-2 Specimen Source Nasopharyngeal     SARS CoV 2 Overall Result Negative            IMAGING:  My impression of the CT abdomen is that the gallblader is distended with a thickened wall    CARDIAC:  My impression of the EKG is NSR with no ST elevation or depression        ASSESSMENT & PLAN     KHADEEJA ELDEN is a 68 y.o. female admitted to inpatient with the following conditions:    1. Suspected acute cholecystitis  2. Dehydration  3. Diabetes mellitus, controlled    Plan:  1. Admit to inpatient since the patient has persistent symptoms for 48 hours with significant tenderness in the RUQ, and because her underlying diabetes complicates her case and places her at greater risk for progressive infection.  She does not meet sepsis criteria despite fever and an elevated WBC, but markers of infection may be muted in patients with diabetes.  2. IV Ancef 1g Q8 per Dr. Jerelyn Scott from surgery with whom I discussed the case  3. HIDA scan in the AM  4. May need an MRCP if HIDA inconclusive and symptoms continue  5. Likely needs laproscopic cholecystectomy  6. Aggressive IV hydration  7. NPO past midnight  8. Hold metformin and Basaglar insulin while NPO; accuchecks and sliding scale insulin  9. Percocet PRN for pain since she has had anaphylaxis with codeine; avoid IV morphine for now    Signed,  Jaleigha Deane R Valina Maes    08/06/2018 9:14 PM  Time Elapsed:

## 2018-08-06 NOTE — ED Notes (Signed)
[facility] ED NURSING NOTE FOR THE RECEIVING INPATIENT NURSE   ED [place] RN     [place] [phone]   ED CHARGE RN [phone]   ADMISSION INFORMATION   Ruth [name] is a 68 y.o. female admitted with a diagnosis of:    1. RUQ abdominal pain    2. Cholecystitis, acute    3. Lower urinary tract infection, acute    4. Type 2 diabetes mellitus without complication, with long-term current use of insulin         Isolation: None   Allergies: Codeine and Ciprofloxacin   Holding Orders confirmed? Yes   Belongings Documented? Yes   Home medications sent to pharmacy confirmed? Yes   NURSING CARE   Mental Status: alert and oriented   ADL: Independent with all ADLs   Ambulation: no difficulty     Personal Protective Equipment (PPE) (F2)     Gloves, Goggles, N95, Procedure Gown, Shoe Covers and Surgical / Bouffant Cap    Pertinent Information  and Safety Concerns: From home. Self care. Last solid and drink @ 1000 this morning.      ED Medications: See below, ED ISHAPED (P) Plan for medications administered in the ED   CT / NIH   CT Head ordered on this patient?  No   NIH/Dysphagia assessment done prior to admission? n/a    VITAL SIGNS   Time BP Temp Pulse Resp SpO2   1849 166/73 98.6 89 18 99 % on RA   IV LINES   IV Catheter Size: 20 g lac   Peripheral IV 08/06/18 Left Antecubital (Active)   Site Assessment Clean;Dry;Intact 08/06/2018  3:00 PM   Line Status Saline Locked 08/06/2018  3:00 PM   Dressing Status Clean;Dry;Intact 08/06/2018  3:00 PM   Dressing Dated? Yes 08/06/2018  3:00 PM   Number of days: 0        LAB RESULTS   Labs Reviewed   COMPREHENSIVE METABOLIC PANEL - Abnormal; Notable for the following components:       Result Value    Glucose 120 (*)     Alkaline Phosphatase 111 (*)     All other components within normal limits    Narrative:     Replace urinary catheter prior to obtaining the urine culture  if it has been in place for greater than or equal to 14  days:->N/A No Foley  Indications for U/A Reflex to Micro - Reflex  to  Culture:->Suprapubic Pain/Tenderness or Dysuria   CBC AND DIFFERENTIAL - Abnormal; Notable for the following components:    WBC 10.15 (*)     Hgb 14.9 (*)     Hematocrit 45.1 (*)     MPV 8.2 (*)     Neutrophils Absolute 6.93 (*)     All other components within normal limits   URINALYSIS WITH MICROSCOPIC - Abnormal; Notable for the following components:    Leukocyte Esterase, UA Small (*)     Nitrite, UA Positive (*)     WBC, UA 11 - 25 (*)     All other components within normal limits   URINE CULTURE    Narrative:     Replace urinary catheter prior to obtaining the urine culture  if it has been in place for greater than or equal to 14  days:->N/A No Foley  Indications for Urine Culture:->Suprapubic Pain/Tenderness or  Dysuria   COVID-19 (SARS-COV-2)    Narrative:     o Collect and clearly label specimen type:  o Upper respiratory specimen: One Nasopharyngeal Dry Swab NO  Transport Media.  o Hand deliver to laboratory ASAP   LIPASE    Narrative:     Replace urinary catheter prior to obtaining the urine culture  if it has been in place for greater than or equal to 14  days:->N/A No Foley  Indications for U/A Reflex to Micro - Reflex to  Culture:->Suprapubic Pain/Tenderness or Dysuria   HEMOLYSIS INDEX    Narrative:     Replace urinary catheter prior to obtaining the urine culture  if it has been in place for greater than or equal to 14  days:->N/A No Foley  Indications for U/A Reflex to Micro - Reflex to  Culture:->Suprapubic Pain/Tenderness or Dysuria   GFR    Narrative:     Replace urinary catheter prior to obtaining the urine culture  if it has been in place for greater than or equal to 14  days:->N/A No Foley  Indications for U/A Reflex to Micro - Reflex to  Culture:->Suprapubic Pain/Tenderness or Dysuria   URINALYSIS REFLEX TO MICROSCOPIC EXAM - REFLEX TO CULTURE    Narrative:     Replace urinary catheter prior to obtaining the urine culture  if it has been in place for greater than or equal to 14  days:->N/A  No Foley  Indications for U/A Reflex to Micro - Reflex to  Culture:->Suprapubic Pain/Tenderness or Dysuria   CBC AND DIFFERENTIAL    Narrative:     Replace urinary catheter prior to obtaining the urine culture  if it has been in place for greater than or equal to 14  days:->N/A No Foley  Indications for U/A Reflex to Micro - Reflex to  Culture:->Suprapubic Pain/Tenderness or Dysuria   LIPASE

## 2018-08-06 NOTE — Consults (Signed)
SURGICAL CONSULTATION    Date Time: 08/06/18 7:30 PM  Patient Name: Ruth [name]  Surgical Attending: Smith Mince, MD     History of Present Illness:   Ruth [name] is [name] 68 y.o. female who presents to the hospital with right sided abdominal pain consistent with biliary source,    Past Medical History:     Past Medical History:   Diagnosis Date   . Diabetes mellitus     type2       Past Surgical History:     Past Surgical History:   Procedure Laterality Date   . KNEE SURGERY  2003       Family History:     Family History   Problem Relation Age of Onset   . Heart disease Mother    . Hyperlipidemia Mother    . Diabetes Mother    . Diabetes Father    . Heart disease Father    . Hyperlipidemia Father        Social History:     Social History     Socioeconomic History   . Marital status: Single     Spouse name: Not on file   . Number of children: Not on file   . Years of education: Not on file   . Highest education level: Not on file   Occupational History   . Not on file   Social Needs   . Financial resource strain: Not on file   . Food insecurity:     Worry: Not on file     Inability: Not on file   . Transportation needs:     Medical: Not on file     Non-medical: Not on file   Tobacco Use   . Smoking status: Never Smoker   . Smokeless tobacco: Never Used   Substance and Sexual Activity   . Alcohol use: Not on file   . Drug use: Not on file   . Sexual activity: Not on file   Lifestyle   . Physical activity:     Days per week: Not on file     Minutes per session: Not on file   . Stress: Not on file   Relationships   . Social connections:     Talks on phone: Not on file     Gets together: Not on file     Attends religious service: Not on file     Active member of club or organization: Not on file     Attends meetings of clubs or organizations: Not on file     Relationship status: Not on file   . Intimate partner violence:     Fear of current or ex partner: Not on file     Emotionally abused: Not on file      Physically abused: Not on file     Forced sexual activity: Not on file   Other Topics Concern   . Not on file   Social History Narrative   . Not on file       Allergies:     Allergies   Allergen Reactions   . Codeine    . Ciprofloxacin Rash       Medications:   (Not in [name] hospital admission)      Review of Systems:   [name] comprehensive review of systems was: History obtained from the patient  General ROS: Negative  Respiratory ROS: no cough, shortness of breath, or wheezing  Cardiovascular ROS: no chest  pain or dyspnea on exertion  Gastrointestinal ROS: no abdominal pain, change in bowel habits, or black or bloody stools  Genito-Urinary ROS: no dysuria, trouble voiding, or hematuria  Dermatological ROS: negative    Physical Exam:     Vitals:    08/06/18 1849   BP: 166/73   Pulse: 89   Resp: 18   Temp: 98.6 F (37 C)   SpO2: 99%       Intake and Output Summary (Last 24 hours) at Date Time  No intake or output data in the 24 hours ending 08/06/18 1930    Physical Exam:     General: awake, alert, oriented x 3; no acute distress.  Neck: supple, no lymphadenopathy, no thyromegaly  Cardiovascular: regular rate and rhythm, no murmurs, rubs or gallops  Lungs: clear to auscultation bilaterally, without wheezing, rhonchi, or rales  Abdomen: soft, RUQ-tender, non-distended; no palpable masses, no hepatosplenomegaly, normoactive bowel sounds, no rebound or guarding;   Skin: no rashes or lesions noted  Other:     Labs:     Results     Procedure Component Value Units Date/Time    Urine culture [[id number]] Collected:  08/06/18 1502    Specimen:  Urine, Clean Catch Updated:  08/06/18 1902    Narrative:       Replace urinary catheter prior to obtaining the urine culture  if it has been in place for greater than or equal to 14  days:->N/[name] No Foley  Indications for Urine Culture:->Suprapubic Pain/Tenderness or  Dysuria    COVID-19 (SARS-CoV-2) [facility] Rapid) [[id number]] Collected:  08/06/18 1854    Specimen:  Nasopharyngeal Swab Updated:   08/06/18 1854    Narrative:       o Collect and clearly label specimen type:  o Upper respiratory specimen: One Nasopharyngeal Dry Swab NO  Transport Media.  o Hand deliver to laboratory ASAP    Hemolysis index [[id number]] Collected:  08/06/18 1502     Updated:  08/06/18 1530     Hemolysis Index 5    Narrative:       Replace urinary catheter prior to obtaining the urine culture  if it has been in place for greater than or equal to 14  days:->N/[name] No Foley  Indications for U/[name] Reflex to Micro - Reflex to  Culture:->Suprapubic Pain/Tenderness or Dysuria    GFR [[id number]] Collected:  08/06/18 1502     Updated:  08/06/18 1530     EGFR >60.0    Narrative:       Replace urinary catheter prior to obtaining the urine culture  if it has been in place for greater than or equal to 14  days:->N/[name] No Foley  Indications for U/[name] Reflex to Micro - Reflex to  Culture:->Suprapubic Pain/Tenderness or Dysuria    Comprehensive metabolic panel [[id number]]  (Abnormal) Collected:  08/06/18 1502    Specimen:  Blood Updated:  08/06/18 1530     Glucose 120 mg/dL      BUN 10.2 mg/dL      Creatinine 0.7 mg/dL      Sodium 725 mEq/L      Potassium 4.1 mEq/L      Chloride 103 mEq/L      CO2 27 mEq/L      Calcium 9.5 mg/dL      Protein, Total 7.6 g/dL      Albumin 4.2 g/dL      AST (SGOT) 20 U/L      ALT 24 U/L      Alkaline  Phosphatase 111 U/L      Bilirubin, Total 0.4 mg/dL      Globulin 3.4 g/dL      Albumin/Globulin Ratio 1.2     Anion Gap 10.0    Narrative:       Replace urinary catheter prior to obtaining the urine culture  if it has been in place for greater than or equal to 14  days:->N/[name] No Foley  Indications for U/[name] Reflex to Micro - Reflex to  Culture:->Suprapubic Pain/Tenderness or Dysuria    Lipase [[id number]] Collected:  08/06/18 1502    Specimen:  Blood Updated:  08/06/18 1530     Lipase 27 U/L     Narrative:       Replace urinary catheter prior to obtaining the urine culture  if it has been in place for greater than or equal to 14   days:->N/[name] No Foley  Indications for U/[name] Reflex to Micro - Reflex to  Culture:->Suprapubic Pain/Tenderness or Dysuria    Urinalysis with microscopic (pts < 3 yrs) [[id number]]  (Abnormal) Collected:  08/06/18 1502    Specimen:  Urine Updated:  08/06/18 1523     Urine Type Clean Catch     Color, UA Yellow     Clarity, UA Hazy     Specific Gravity UA 1.027     Urine pH 5.0     Leukocyte Esterase, UA Small     Nitrite, UA Positive     Protein, UR Negative     Glucose, UA Negative     Ketones UA Negative     Urobilinogen, UA Negative mg/dL      Bilirubin, UA Negative     Blood, UA Negative     RBC, UA 0 - 2 /hpf      WBC, UA 11 - 25 /hpf      Squamous Epithelial Cells, Urine 0 - 5 /hpf      Urine Mucus Present    CBC and differential [[id number]]  (Abnormal) Collected:  08/06/18 1502    Specimen:  Blood Updated:  08/06/18 1513     WBC 10.15 x10 3/uL      Hgb 14.9 g/dL      Hematocrit 21.3 %      Platelets 317 x10 3/uL      RBC 4.85 x10 6/uL      MCV 93.0 fL      MCH 30.7 pg      MCHC 33.0 g/dL      RDW 13 %      MPV 8.2 fL      Neutrophils 68.2 %      Lymphocytes Automated 22.1 %      Monocytes 7.0 %      Eosinophils Automated 2.1 %      Basophils Automated 0.3 %      Immature Granulocyte 0.3 %      Nucleated RBC 0.0 /100 WBC      Neutrophils Absolute 6.93 x10 3/uL      Abs Lymph Automated 2.24 x10 3/uL      Abs Mono Automated 0.71 x10 3/uL      Abs Eos Automated 0.21 x10 3/uL      Absolute Baso Automated 0.03 x10 3/uL      Absolute Immature Granulocyte 0.03 x10 3/uL      Absolute NRBC 0.00 x10 3/uL     Lipase [[id number]] Collected:  08/06/18 1502    Specimen:  Blood Updated:  08/06/18 1503    UA Reflex to  Micro - Reflex to Culture [[id number]] Collected:  08/06/18 1502     Updated:  08/06/18 1503    Narrative:       Replace urinary catheter prior to obtaining the urine culture  if it has been in place for greater than or equal to 14  days:->N/[name] No Foley  Indications for U/[name] Reflex to Micro - Reflex to  Culture:->Suprapubic  Pain/Tenderness or Dysuria    CBC and differential [[id number]] Collected:  08/06/18 1502    Specimen:  Blood Updated:  08/06/18 1503    Narrative:       Replace urinary catheter prior to obtaining the urine culture  if it has been in place for greater than or equal to 14  days:->N/[name] No Foley  Indications for U/[name] Reflex to Micro - Reflex to  Culture:->Suprapubic Pain/Tenderness or Dysuria          Rads:     Radiology Results (24 Hour)     Procedure Component Value Units Date/Time    US Abdomen Limited RUQ [[id number]] Collected:  08/06/18 1612    Order Status:  Completed Updated:  08/06/18 1619    Narrative:       History: ABDOMINAL PAIN    Findings: The gallbladder is moderately thick walled. Small amount of  sludge is seen in the gallbladder. No intrahepatic biliary ductal  dilatation is seen. Common bile duct is 3 mm in diameter moderate ring  down artifact is seen in the gallbladder wall. Incidental note is made  of fatty infiltration of the liver      Impression:         1. Small amount of sludge in the gallbladder  2. Diffuse gallbladder wall thickening with ringdown artifact in [name]  pattern often associated with adenomyosis. If the patient has acute  right upper quadrant pain it would be difficult to exclude superimposed  acute or chronic cholecystitis as we have no prior sonograms available  for comparison. If there is clinical concern for acute cholecystitis  hepatobiliary scan with morphine may be [name] useful adjunctive imaging  study  3. No biliary ductal obstruction  4. Fatty liver    Laurena Slimmer, MD   08/06/2018 4:14 PM    CT Abd/Pelvis with IV Contrast only [[id number]] Collected:  08/06/18 1553    Order Status:  Completed Updated:  08/06/18 1606    Narrative:       CLINICAL STATEMENT: RLQ abdominal pain, appendicitis suspected (Age >  14y)    TECHNIQUE: Helical axial imaging from above the domes of diaphragm  through the pubic symphysis following administration of 100 cc of  Omnipaque 350  intravenously and  without intraluminal bowel contrast.  Sagittal and coronal reconstructions were obtained.     Note that CT scanning at this site utilizes multiple dose reduction  techniques including automatic exposure control, adjustment of the MAS  and/or KVP according to patient size, and use of iterative  reconstruction technique.    COMPARISON: No prior studies are available for comparison.    FINDINGS:     Lower chest: The lung bases are clear.    Hepatobiliary: The liver is grossly unremarkable in appearance. There is  no significant intrahepatic biliary ductal dilatation.     Gallbladder: Unremarkable.    Spleen: Unremarkable.     Pancreas: Unremarkable.     Adrenal glands: Unremarkable.     Kidneys: Small parapelvic cyst in the right kidney. No hydronephrosis.  No perinephric stranding.     Lymph nodes:  No abdominal or pelvic lymphadenopathy.    Hollow viscus: The large and small bowel are normal in caliber.  The  appendix is either surgically absent or congenitally small     Peritoneal cavity: No ascites is present. There is no free air. There  are multiple prominent mesenteric lymph nodes and there is hazy  infiltration of the fat in the root of the mesentery.    Aorta: No significant abnormalities.    Miscellaneous pelvis: No significant findings.    Musculoskeletal: The osseous structures are intact.      Impression:            1. Multiple prominent mesenteric lymph nodes and hazy infiltration of  the fat in the root of the mesentery. 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 Other differential consideration  including mesenteric lymphoma, inflammatory bowel disease, idiopathic,  and mesenteric venous thrombosis are considered to be less likely. Aldean Ast, MD   08/06/2018 4:02 PM          Additional Studies:         Radiological Procedure reviewed.    Assessment:   Right sided abdominal pain  1. Small amount of sludge in the gallbladder  2. Diffuse gallbladder wall thickening with ringdown  artifact in [name]  pattern often associated with adenomyosis. If the patient has acute  right upper quadrant pain it would be difficult to exclude superimposed  acute or chronic cholecystitis as we have no prior sonograms available  for comparison. If there is clinical concern for acute cholecystitis  hepatobiliary scan with morphine may be [name] useful adjunctive imaging  study  3. No biliary ductal obstruction  4. Fatty liver      Plan:   HIDA scan in am to confirm cholecystitis  Likely acute on chronic given appearance on [place]  Plan laparoscopic cholecystectomy   IV antibiotics      Signed by: Smith Mince

## 2018-08-06 NOTE — ED Provider Notes (Signed)
EMERGENCY DEPARTMENT HISTORY AND PHYSICAL EXAM     Physician/Midlevel provider first contact with patient: 08/06/18 1420         Date: 08/06/2018  Patient Name: Ruth [name]    History of Presenting Illness     Chief Complaint   Patient presents with   . Abdominal Pain       History Provided By: Patient    Chief Complaint: Ruth [name] is a 68 y.o. female with history of diabetes who presents to the ED c/o 2 days constant, severe, 4/10 to 8/10, right upper and right lower quadrant abdominal pain that radiates to her right mid- back.  She states that the pain yesterday was was worse when she moved around and was accompanied by a decrease in appetite.  She had a telemedicine conference with her doctor yesterday.  Her insurance company today advised her to come to the emergency department because the symptoms were worse.    PCP: Joice Lofts, MD        No current facility-administered medications for this encounter.      Current Outpatient Medications   Medication Sig Dispense Refill   . Dulaglutide (TRULICITY SC) Inject into the skin once a week.     . Insulin Degludec 100 UNIT/ML Solution Pen-injector Inject into the skin daily.     . insulin glargine 100 UNIT/ML injection pen Inject 28 Units into the skin nightly     . metFORMIN (GLUCOPHAGE) 1000 MG tablet Take 1,000 mg by mouth 2 (two) times daily with meals.     Marland Kitchen glimepiride (AMARYL) 4 MG tablet Take 4 mg by mouth 2 (two) times daily.     Marland Kitchen venlafaxine (EFFEXOR) 75 MG tablet Take 250 mg by mouth daily.         Past History     Past Medical History:  Past Medical History:   Diagnosis Date   . Diabetes mellitus     type2       Past Surgical History:  Past Surgical History:   Procedure Laterality Date   . KNEE SURGERY  2003       Family History:  Family History   Problem Relation Age of Onset   . Heart disease Mother    . Hyperlipidemia Mother    . Diabetes Mother    . Diabetes Father    . Heart disease Father    . Hyperlipidemia Father        Social  History:  Social History     Tobacco Use   . Smoking status: Never Smoker   . Smokeless tobacco: Never Used   Substance Use Topics   . Alcohol use: Not on file   . Drug use: Not on file       Allergies:  Allergies   Allergen Reactions   . Codeine    . Ciprofloxacin Rash       Review of Systems     Review of Systems   Constitutional: Negative for chills, diaphoresis, fever and weight loss.        Decreased appetite yesterday   HENT: Negative for congestion, ear discharge, nosebleeds and sore throat.    Eyes: Negative for discharge and redness.   Respiratory: Negative for cough, hemoptysis, sputum production, shortness of breath and wheezing.    Cardiovascular: Negative for chest pain and palpitations.   Gastrointestinal: Positive for abdominal pain and nausea (Yesterday). Negative for constipation, diarrhea and vomiting.   Genitourinary: Positive for flank pain (Right). Negative  for dysuria, frequency, hematuria and urgency.   Musculoskeletal: Positive for back pain. Negative for joint pain and neck pain.   Skin: Negative.    Neurological: Negative for dizziness, sensory change, speech change, focal weakness, seizures, loss of consciousness and headaches.   Psychiatric/Behavioral: Negative for suicidal ideas.       Physical Exam   BP 166/73   Pulse 89   Temp 98.6 F (37 C) (Oral)   Resp 18   Ht 5\' 2"  (1.575 m)   Wt 65.8 kg   SpO2 99%   BMI 26.52 kg/m     Physical Exam   Constitutional: She is oriented to person, place, and time. Vital signs are normal. She appears well-developed and well-nourished. She is active and cooperative. She is easily aroused.  Non-toxic appearance. She does not have a sickly appearance. She does not appear ill. No distress.   HENT:   Head: Normocephalic and atraumatic.   Right Ear: External ear normal.   Left Ear: External ear normal.   Mouth/Throat: Oropharynx is clear and moist. No oropharyngeal exudate.   Eyes: Pupils are equal, round, and reactive to light. Conjunctivae and EOM  are normal. Right eye exhibits no discharge. Left eye exhibits no discharge.   Neck: Normal range of motion. Neck supple. No tracheal deviation present.   Cardiovascular: Normal rate, regular rhythm, normal heart sounds and intact distal pulses.   Pulmonary/Chest: Effort normal and breath sounds normal. No stridor. No respiratory distress. She has no wheezes. She has no rales. She exhibits no tenderness.   Abdominal: Soft. Normal appearance, normal aorta and bowel sounds are normal. She exhibits no distension, no pulsatile midline mass and no mass. There is no hepatosplenomegaly. There is abdominal tenderness in the right upper quadrant and right lower quadrant. There is tenderness at McBurney's point and positive Murphy's sign. There is no rebound and no CVA tenderness.       Musculoskeletal: Normal range of motion.   Lymphadenopathy:     She has no cervical adenopathy.   Neurological: She is alert, oriented to person, place, and time and easily aroused. She has normal strength. She is not disoriented. No sensory deficit. Coordination and gait normal. GCS eye subscore is 4. GCS verbal subscore is 5. GCS motor subscore is 6.   Skin: Skin is warm and dry. No rash noted.   Psychiatric: She has a normal mood and affect. Her speech is normal and behavior is normal. Judgment and thought content normal. Cognition and memory are normal.   Nursing note and vitals reviewed.      Diagnostic Study Results     Labs -     Results     Procedure Component Value Units Date/Time    Urine culture [[id number]] Collected:  08/06/18 1502    Specimen:  Urine, Clean Catch Updated:  08/06/18 1902    Narrative:       Replace urinary catheter prior to obtaining the urine culture  if it has been in place for greater than or equal to 14  days:->N/A No Foley  Indications for Urine Culture:->Suprapubic Pain/Tenderness or  Dysuria    COVID-19 (SARS-CoV-2) [facility] Rapid) [[id number]] Collected:  08/06/18 1854    Specimen:  Nasopharyngeal Swab  Updated:  08/06/18 1854    Narrative:       o Collect and clearly label specimen type:  o Upper respiratory specimen: One Nasopharyngeal Dry Swab NO  Transport Media.  o Hand deliver to laboratory ASAP    Hemolysis index [  [id number]] Collected:  08/06/18 1502     Updated:  08/06/18 1530     Hemolysis Index 5    Narrative:       Replace urinary catheter prior to obtaining the urine culture  if it has been in place for greater than or equal to 14  days:->N/A No Foley  Indications for U/A Reflex to Micro - Reflex to  Culture:->Suprapubic Pain/Tenderness or Dysuria    GFR [[id number]] Collected:  08/06/18 1502     Updated:  08/06/18 1530     EGFR >60.0    Narrative:       Replace urinary catheter prior to obtaining the urine culture  if it has been in place for greater than or equal to 14  days:->N/A No Foley  Indications for U/A Reflex to Micro - Reflex to  Culture:->Suprapubic Pain/Tenderness or Dysuria    Comprehensive metabolic panel [[id number]]  (Abnormal) Collected:  08/06/18 1502    Specimen:  Blood Updated:  08/06/18 1530     Glucose 120 mg/dL      BUN 21.3 mg/dL      Creatinine 0.7 mg/dL      Sodium 086 mEq/L      Potassium 4.1 mEq/L      Chloride 103 mEq/L      CO2 27 mEq/L      Calcium 9.5 mg/dL      Protein, Total 7.6 g/dL      Albumin 4.2 g/dL      AST (SGOT) 20 U/L      ALT 24 U/L      Alkaline Phosphatase 111 U/L      Bilirubin, Total 0.4 mg/dL      Globulin 3.4 g/dL      Albumin/Globulin Ratio 1.2     Anion Gap 10.0    Narrative:       Replace urinary catheter prior to obtaining the urine culture  if it has been in place for greater than or equal to 14  days:->N/A No Foley  Indications for U/A Reflex to Micro - Reflex to  Culture:->Suprapubic Pain/Tenderness or Dysuria    Lipase [[id number]] Collected:  08/06/18 1502    Specimen:  Blood Updated:  08/06/18 1530     Lipase 27 U/L     Narrative:       Replace urinary catheter prior to obtaining the urine culture  if it has been in place for greater than or equal  to 14  days:->N/A No Foley  Indications for U/A Reflex to Micro - Reflex to  Culture:->Suprapubic Pain/Tenderness or Dysuria    Urinalysis with microscopic (pts < 3 yrs) [[id number]]  (Abnormal) Collected:  08/06/18 1502    Specimen:  Urine Updated:  08/06/18 1523     Urine Type Clean Catch     Color, UA Yellow     Clarity, UA Hazy     Specific Gravity UA 1.027     Urine pH 5.0     Leukocyte Esterase, UA Small     Nitrite, UA Positive     Protein, UR Negative     Glucose, UA Negative     Ketones UA Negative     Urobilinogen, UA Negative mg/dL      Bilirubin, UA Negative     Blood, UA Negative     RBC, UA 0 - 2 /hpf      WBC, UA 11 - 25 /hpf      Squamous Epithelial Cells, Urine 0 - 5 /hpf  Urine Mucus Present    CBC and differential [[id number]]  (Abnormal) Collected:  08/06/18 1502    Specimen:  Blood Updated:  08/06/18 1513     WBC 10.15 x10 3/uL      Hgb 14.9 g/dL      Hematocrit 54.0 %      Platelets 317 x10 3/uL      RBC 4.85 x10 6/uL      MCV 93.0 fL      MCH 30.7 pg      MCHC 33.0 g/dL      RDW 13 %      MPV 8.2 fL      Neutrophils 68.2 %      Lymphocytes Automated 22.1 %      Monocytes 7.0 %      Eosinophils Automated 2.1 %      Basophils Automated 0.3 %      Immature Granulocyte 0.3 %      Nucleated RBC 0.0 /100 WBC      Neutrophils Absolute 6.93 x10 3/uL      Abs Lymph Automated 2.24 x10 3/uL      Abs Mono Automated 0.71 x10 3/uL      Abs Eos Automated 0.21 x10 3/uL      Absolute Baso Automated 0.03 x10 3/uL      Absolute Immature Granulocyte 0.03 x10 3/uL      Absolute NRBC 0.00 x10 3/uL     Lipase [[id number]] Collected:  08/06/18 1502    Specimen:  Blood Updated:  08/06/18 1503    UA Reflex to Micro - Reflex to Culture [[id number]] Collected:  08/06/18 1502     Updated:  08/06/18 1503    Narrative:       Replace urinary catheter prior to obtaining the urine culture  if it has been in place for greater than or equal to 14  days:->N/A No Foley  Indications for U/A Reflex to Micro - Reflex to   Culture:->Suprapubic Pain/Tenderness or Dysuria    CBC and differential [[id number]] Collected:  08/06/18 1502    Specimen:  Blood Updated:  08/06/18 1503    Narrative:       Replace urinary catheter prior to obtaining the urine culture  if it has been in place for greater than or equal to 14  days:->N/A No Foley  Indications for U/A Reflex to Micro - Reflex to  Culture:->Suprapubic Pain/Tenderness or Dysuria          Radiologic Studies -   Radiology Results (24 Hour)     Procedure Component Value Units Date/Time    US Abdomen Limited RUQ [[id number]] Collected:  08/06/18 1612    Order Status:  Completed Updated:  08/06/18 1619    Narrative:       History: ABDOMINAL PAIN    Findings: The gallbladder is moderately thick walled. Small amount of  sludge is seen in the gallbladder. No intrahepatic biliary ductal  dilatation is seen. Common bile duct is 3 mm in diameter moderate ring  down artifact is seen in the gallbladder wall. Incidental note is made  of fatty infiltration of the liver      Impression:         1. Small amount of sludge in the gallbladder  2. Diffuse gallbladder wall thickening with ringdown artifact in a  pattern often associated with adenomyosis. If the patient has acute  right upper quadrant pain it would be difficult to exclude superimposed  acute or chronic cholecystitis as we have  no prior sonograms available  for comparison. If there is clinical concern for acute cholecystitis  hepatobiliary scan with morphine may be a useful adjunctive imaging  study  3. No biliary ductal obstruction  4. Fatty liver    Laurena Slimmer, MD   08/06/2018 4:14 PM    CT Abd/Pelvis with IV Contrast only [[id number]] Collected:  08/06/18 1553    Order Status:  Completed Updated:  08/06/18 1606    Narrative:       CLINICAL STATEMENT: RLQ abdominal pain, appendicitis suspected (Age >  14y)    TECHNIQUE: Helical axial imaging from above the domes of diaphragm  through the pubic symphysis following administration of 100 cc of   Omnipaque 350  intravenously and without intraluminal bowel contrast.  Sagittal and coronal reconstructions were obtained.     Note that CT scanning at this site utilizes multiple dose reduction  techniques including automatic exposure control, adjustment of the MAS  and/or KVP according to patient size, and use of iterative  reconstruction technique.    COMPARISON: No prior studies are available for comparison.    FINDINGS:     Lower chest: The lung bases are clear.    Hepatobiliary: The liver is grossly unremarkable in appearance. There is  no significant intrahepatic biliary ductal dilatation.     Gallbladder: Unremarkable.    Spleen: Unremarkable.     Pancreas: Unremarkable.     Adrenal glands: Unremarkable.     Kidneys: Small parapelvic cyst in the right kidney. No hydronephrosis.  No perinephric stranding.     Lymph nodes: No abdominal or pelvic lymphadenopathy.    Hollow viscus: The large and small bowel are normal in caliber.  The  appendix is either surgically absent or congenitally small     Peritoneal cavity: No ascites is present. There is no free air. There  are multiple prominent mesenteric lymph nodes and there is hazy  infiltration of the fat in the root of the mesentery.    Aorta: No significant abnormalities.    Miscellaneous pelvis: No significant findings.    Musculoskeletal: The osseous structures are intact.      Impression:            1. Multiple prominent mesenteric lymph nodes and hazy infiltration of  the fat in the root of the mesentery. 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 Other differential consideration  including mesenteric lymphoma, inflammatory bowel disease, idiopathic,  and mesenteric venous thrombosis are considered to be less likely. Aldean Ast, MD   08/06/2018 4:02 PM            Medical Decision Making   I am the first provider for this patient.    I reviewed today's vital signs and ED nursing notes.    Vital Signs-Reviewed the patient's vital signs.      Patient Vitals for the past 12 hrs:   BP Temp Pulse Resp   08/06/18 1849 166/73 98.6 F (37 C) 89 18   08/06/18 1715 164/76 - 83 -   08/06/18 1625 148/85 99.5 F (37.5 C) 79 18   08/06/18 1407 143/85 97.9 F (36.6 C) 82 18       Pulse Oximetry Analysis - Normal, 99% on RA    Provider Notes:    1630: [facility] manager, Dr. Yisroel Ramming, paged.  Patient was re-examined.  She continues to have tenderness in the right upper quadrant right flank and right lower quadrant.  No rebound tenderness.  1650: Spoke with Dr. Yisroel Ramming who recommends that the patient be admitted to medicine and have a HIDA scan.  He also recommends 2 g of Ancef IV.  Sound hospitalist will be called.    1655: Dr. Lyn Hollingshead, sound hospitalist, return call with patient is deciding whether she wants to be admitted to the hospital.    1710: Patient is agreed to admission.  Her current blood sugar level is 59.  IV fluid order is changed and IV of D10 250 mL's is ordered.  Nurse is aware and administering.  COVID-19 test (rapid [place]) ordered.    1735: Sound has not returned page.      1740: Spoke with Dr. Lyn Hollingshead (sound hospitalist).  He accepts patient to inpatient MedSurg.  No isolation.    Personal Protective Equipment (PPE):Gloves, Goggles, N95, Shoe Covers and Surgical / Bouffant Cap      Diagnosis     Clinical Impression:   1. RUQ abdominal pain    2. Cholecystitis, acute    3. Lower urinary tract infection, acute    4. Type 2 diabetes mellitus without complication, with long-term current use of insulin            This chart was generated using hospital voice-recognition software which does not employ spell-checking or grammar-checking features. It was dictated, all or in part, in a busy and often noisy patient care environment. I have taken all usual measures to dictate carefully and to review all aspects this chart. Nonetheless, given the known and well-documented performance characteristics of VR software in such patient care environments, this  dictation still may contain unrecognized and wholly unintended errors or omissions  _______________________________           Fransisca Connors, PA  08/06/18 1914       Juliane Poot, MD PhD  08/06/18 2027

## 2018-08-06 NOTE — ED Triage Notes (Signed)
Ruth [name] is a 68 y.o. female c/o R sided abdominal pain that started 2 days ago. No N/V/D. No urinary symptoms. No fever or cough.

## 2018-08-07 ENCOUNTER — Ambulatory Visit: Payer: Self-pay

## 2018-08-07 ENCOUNTER — Inpatient Hospital Stay: Payer: BLUE CROSS/BLUE SHIELD

## 2018-08-07 ENCOUNTER — Observation Stay: Payer: BLUE CROSS/BLUE SHIELD | Admitting: Certified Registered"

## 2018-08-07 ENCOUNTER — Encounter
Admission: EM | Disposition: A | Discharge status: Home / Self Care | Payer: Self-pay | Source: Home / Self Care | Attending: Emergency Medicine

## 2018-08-07 DIAGNOSIS — E119 Type 2 diabetes mellitus without complications: Secondary | ICD-10-CM | POA: Diagnosis present

## 2018-08-07 DIAGNOSIS — F32A Depression, unspecified: Secondary | ICD-10-CM | POA: Diagnosis present

## 2018-08-07 DIAGNOSIS — K819 Cholecystitis, unspecified: Secondary | ICD-10-CM | POA: Diagnosis present

## 2018-08-07 HISTORY — PX: LAPAROSCOPIC, CHOLECYSTECTOMY: SHX4474

## 2018-08-07 LAB — CBC
Absolute NRBC: 0 10*3/uL (ref 0.00–0.00)
Hematocrit: 43.6 % (ref 34.7–43.7)
Hgb: 14.2 g/dL (ref 11.4–14.8)
MCH: 30.5 pg (ref 25.1–33.5)
MCHC: 32.6 g/dL (ref 31.5–35.8)
MCV: 93.8 fL (ref 78.0–96.0)
MPV: 8.6 fL — ABNORMAL LOW (ref 8.9–12.5)
Nucleated RBC: 0 /100 WBC (ref 0.0–0.0)
Platelets: 297 10*3/uL (ref 142–346)
RBC: 4.65 10*6/uL (ref 3.90–5.10)
RDW: 14 % (ref 11–15)
WBC: 11.13 10*3/uL — ABNORMAL HIGH (ref 3.10–9.50)

## 2018-08-07 LAB — GLUCOSE WHOLE BLOOD - POCT
Whole Blood Glucose POCT: 140 mg/dL — ABNORMAL HIGH (ref 70–100)
Whole Blood Glucose POCT: 139 mg/dL — ABNORMAL HIGH (ref 70–100)

## 2018-08-07 SURGERY — LAPAROSCOPIC, CHOLECYSTECTOMY
Anesthesia: Anesthesia General | Site: Abdomen | Wound class: Contaminated

## 2018-08-07 MED ORDER — BUPIVACAINE HCL (PF) 0.25 % IJ SOLN
INTRAMUSCULAR | Status: AC
Start: 2018-08-07 — End: ?
  Filled 2018-08-07: qty 30

## 2018-08-07 MED ORDER — HYDROMORPHONE HCL 1 MG/ML IJ SOLN
INTRAMUSCULAR | Status: AC
Start: 2018-08-07 — End: ?
  Filled 2018-08-07: qty 1

## 2018-08-07 MED ORDER — HYDRALAZINE HCL 20 MG/ML IJ SOLN
INTRAMUSCULAR | Status: DC | PRN
Start: 2018-08-07 — End: 2018-08-07
  Administered 2018-08-07 (×2): 5 mg via INTRAVENOUS

## 2018-08-07 MED ORDER — TRAMADOL HCL 50 MG PO TABS
50.0000 mg | ORAL_TABLET | Freq: Three times a day (TID) | ORAL | 0 refills | Status: DC | PRN
Start: 2018-08-07 — End: 2018-08-08

## 2018-08-07 MED ORDER — ONDANSETRON HCL 4 MG/2ML IJ SOLN
INTRAMUSCULAR | Status: DC | PRN
Start: 2018-08-07 — End: 2018-08-07
  Administered 2018-08-07: 4 mg via INTRAVENOUS

## 2018-08-07 MED ORDER — LIDOCAINE HCL 2 % IJ SOLN
INTRAMUSCULAR | Status: DC | PRN
Start: 2018-08-07 — End: 2018-08-07
  Administered 2018-08-07: 80 mg

## 2018-08-07 MED ORDER — PROPOFOL INFUSION 10 MG/ML
INTRAVENOUS | Status: DC | PRN
Start: 2018-08-07 — End: 2018-08-07
  Administered 2018-08-07: 150 mg via INTRAVENOUS

## 2018-08-07 MED ORDER — ENOXAPARIN SODIUM 40 MG/0.4ML SC SOLN
40.00 mg | Freq: Every day | SUBCUTANEOUS | Status: DC
Start: 2018-08-07 — End: 2018-08-08
  Administered 2018-08-08: 10:00:00 40 mg via SUBCUTANEOUS
  Filled 2018-08-07: qty 0.4

## 2018-08-07 MED ORDER — NALOXONE HCL 0.4 MG/ML IJ SOLN (WRAP)
0.20 mg | INTRAMUSCULAR | Status: DC | PRN
Start: 2018-08-07 — End: 2018-08-08
  Administered 2018-08-07: 12:00:00 0.2 mg via INTRAVENOUS
  Filled 2018-08-07: qty 1

## 2018-08-07 MED ORDER — PROMETHAZINE HCL 25 MG/ML IJ SOLN
6.2500 mg | Freq: Once | INTRAMUSCULAR | Status: DC | PRN
Start: 2018-08-07 — End: 2018-08-07

## 2018-08-07 MED ORDER — ROCURONIUM BROMIDE 50 MG/5ML IV SOLN
INTRAVENOUS | Status: DC | PRN
Start: 2018-08-07 — End: 2018-08-07
  Administered 2018-08-07: 5 mg via INTRAVENOUS
  Administered 2018-08-07: 35 mg via INTRAVENOUS

## 2018-08-07 MED ORDER — METOCLOPRAMIDE HCL 5 MG/ML IJ SOLN
INTRAMUSCULAR | Status: AC
Start: 2018-08-07 — End: 2018-08-07
  Administered 2018-08-07: 18:00:00 10 mg via INTRAVENOUS
  Filled 2018-08-07: qty 2

## 2018-08-07 MED ORDER — SODIUM CHLORIDE 0.9 % IV MBP
INTRAVENOUS | Status: AC
Start: 2018-08-07 — End: ?
  Filled 2018-08-07: qty 100

## 2018-08-07 MED ORDER — MIDAZOLAM HCL 1 MG/ML IJ SOLN (WRAP)
INTRAMUSCULAR | Status: AC
Start: 2018-08-07 — End: ?
  Filled 2018-08-07: qty 2

## 2018-08-07 MED ORDER — ONDANSETRON HCL 4 MG/2ML IJ SOLN
4.00 mg | Freq: Four times a day (QID) | INTRAMUSCULAR | Status: DC | PRN
Start: 2018-08-07 — End: 2018-08-08

## 2018-08-07 MED ORDER — ACETAMINOPHEN 325 MG PO TABS
650.0000 mg | ORAL_TABLET | ORAL | Status: DC | PRN
Start: 2018-08-07 — End: 2018-08-08
  Filled 2018-08-07: qty 2

## 2018-08-07 MED ORDER — DEXTROSE-NACL 5-0.9 % IV SOLN
INTRAVENOUS | Status: DC
Start: 2018-08-07 — End: 2018-08-07

## 2018-08-07 MED ORDER — VENLAFAXINE HCL ER 150 MG PO CP24
150.00 mg | ORAL_CAPSULE | Freq: Every morning | ORAL | Status: DC
Start: 2018-08-08 — End: 2018-08-08
  Administered 2018-08-08: 10:00:00 150 mg via ORAL
  Filled 2018-08-07 (×3): qty 1

## 2018-08-07 MED ORDER — NEOSTIGMINE METHYLSULFATE 1 MG/ML IJ/IV SOLN (WRAP)
Status: AC
Start: 2018-08-07 — End: ?
  Filled 2018-08-07: qty 10

## 2018-08-07 MED ORDER — GLYCOPYRROLATE 0.2 MG/ML IJ SOLN
INTRAMUSCULAR | Status: AC
Start: 2018-08-07 — End: ?
  Filled 2018-08-07: qty 3

## 2018-08-07 MED ORDER — KETOROLAC TROMETHAMINE 15 MG/ML IJ SOLN
15.00 mg | Freq: Four times a day (QID) | INTRAMUSCULAR | Status: DC
Start: 2018-08-07 — End: 2018-08-07
  Filled 2018-08-07: qty 1

## 2018-08-07 MED ORDER — FENTANYL CITRATE (PF) 50 MCG/ML IJ SOLN (WRAP)
INTRAMUSCULAR | Status: AC
Start: 2018-08-07 — End: 2018-08-07
  Administered 2018-08-07: 18:00:00 25 ug via INTRAVENOUS
  Filled 2018-08-07: qty 2

## 2018-08-07 MED ORDER — ONDANSETRON 4 MG PO TBDP
4.00 mg | ORAL_TABLET | Freq: Four times a day (QID) | ORAL | Status: DC | PRN
Start: 2018-08-07 — End: 2018-08-08

## 2018-08-07 MED ORDER — DOCUSATE SODIUM 100 MG PO CAPS
100.00 mg | ORAL_CAPSULE | Freq: Two times a day (BID) | ORAL | Status: DC
Start: 2018-08-07 — End: 2018-08-08
  Administered 2018-08-08: 10:00:00 100 mg via ORAL
  Filled 2018-08-07 (×2): qty 1

## 2018-08-07 MED ORDER — LIDOCAINE HCL (PF) 2 % IJ SOLN
INTRAMUSCULAR | Status: AC
Start: 2018-08-07 — End: ?
  Filled 2018-08-07: qty 5

## 2018-08-07 MED ORDER — LACTATED RINGERS IV SOLN
INTRAVENOUS | Status: DC | PRN
Start: 2018-08-07 — End: 2018-08-07

## 2018-08-07 MED ORDER — ROCURONIUM BROMIDE 50 MG/5ML IV SOLN
INTRAVENOUS | Status: AC
Start: 2018-08-07 — End: ?
  Filled 2018-08-07: qty 5

## 2018-08-07 MED ORDER — BUPIVACAINE HCL 0.25 % IJ SOLN
INTRAMUSCULAR | Status: DC | PRN
Start: 2018-08-07 — End: 2018-08-07
  Administered 2018-08-07: 20 mL

## 2018-08-07 MED ORDER — NEOSTIGMINE METHYLSULFATE 1 MG/ML IJ/IV SOLN (WRAP)
Status: DC | PRN
Start: 2018-08-07 — End: 2018-08-07
  Administered 2018-08-07: 4 mg via INTRAVENOUS

## 2018-08-07 MED ORDER — SUCCINYLCHOLINE CHLORIDE 20 MG/ML IJ SOLN
INTRAMUSCULAR | Status: AC
Start: 2018-08-07 — End: ?
  Filled 2018-08-07: qty 10

## 2018-08-07 MED ORDER — TECHNETIUM TC 99M MEBROFENIN
5.20 | Freq: Once | Status: AC | PRN
Start: 2018-08-07 — End: 2018-08-07
  Administered 2018-08-07: 5 via INTRAVENOUS

## 2018-08-07 MED ORDER — HYDRALAZINE HCL 25 MG PO TABS
25.0000 mg | ORAL_TABLET | Freq: Once | ORAL | Status: DC | PRN
Start: 2018-08-07 — End: 2018-08-08

## 2018-08-07 MED ORDER — FAMOTIDINE 20 MG PO TABS
20.0000 mg | ORAL_TABLET | Freq: Two times a day (BID) | ORAL | Status: DC | PRN
Start: 2018-08-07 — End: 2018-08-08

## 2018-08-07 MED ORDER — ONDANSETRON HCL 4 MG/2ML IJ SOLN
INTRAMUSCULAR | Status: AC
Start: 2018-08-07 — End: ?
  Filled 2018-08-07: qty 2

## 2018-08-07 MED ORDER — HYDROMORPHONE HCL 1 MG/ML IJ SOLN
0.5000 mg | INTRAMUSCULAR | Status: DC | PRN
Start: 2018-08-07 — End: 2018-08-07

## 2018-08-07 MED ORDER — INSULIN GLARGINE 100 UNIT/ML SC SOLN
14.00 [IU] | SUBCUTANEOUS | Status: DC
Start: 2018-08-08 — End: 2018-08-08
  Administered 2018-08-08: 10:00:00 14 [IU] via SUBCUTANEOUS
  Filled 2018-08-07: qty 14

## 2018-08-07 MED ORDER — ACETAMINOPHEN 325 MG PO TABS
325.0000 mg | ORAL_TABLET | Freq: Once | ORAL | Status: DC | PRN
Start: 2018-08-07 — End: 2018-08-07

## 2018-08-07 MED ORDER — HYDRALAZINE HCL 20 MG/ML IJ SOLN
INTRAMUSCULAR | Status: AC
Start: 2018-08-07 — End: ?
  Filled 2018-08-07: qty 1

## 2018-08-07 MED ORDER — HYDROMORPHONE HCL 1 MG/ML IJ SOLN
INTRAMUSCULAR | Status: DC | PRN
Start: 2018-08-07 — End: 2018-08-07
  Administered 2018-08-07: 0.5 mg via INTRAVENOUS

## 2018-08-07 MED ORDER — LACTATED RINGERS IV SOLN
50.0000 mL/h | INTRAVENOUS | Status: DC
Start: 2018-08-07 — End: 2018-08-08

## 2018-08-07 MED ORDER — FENTANYL CITRATE (PF) 50 MCG/ML IJ SOLN (WRAP)
INTRAMUSCULAR | Status: DC | PRN
Start: 2018-08-07 — End: 2018-08-07
  Administered 2018-08-07: 100 ug via INTRAVENOUS

## 2018-08-07 MED ORDER — INSULIN GLARGINE 100 UNIT/ML SC SOLN
14.00 [IU] | Freq: Every morning | SUBCUTANEOUS | Status: DC
Start: 2018-08-07 — End: 2018-08-07

## 2018-08-07 MED ORDER — CEFAZOLIN SODIUM 1 G IJ SOLR
INTRAMUSCULAR | Status: AC
Start: 2018-08-07 — End: ?
  Filled 2018-08-07: qty 1000

## 2018-08-07 MED ORDER — GLYCOPYRROLATE 0.2 MG/ML IJ SOLN
INTRAMUSCULAR | Status: DC | PRN
Start: 2018-08-07 — End: 2018-08-07
  Administered 2018-08-07: .6 mg via INTRAVENOUS

## 2018-08-07 MED ORDER — VENLAFAXINE HCL 75 MG PO TABS
225.0000 mg | ORAL_TABLET | Freq: Every day | ORAL | Status: DC
Start: 2018-08-07 — End: 2018-08-07
  Filled 2018-08-07: qty 3

## 2018-08-07 MED ORDER — MORPHINE SULFATE 4 MG/ML IJ/IV SOLN (WRAP)
3.0000 mg | Status: DC | PRN
Start: 2018-08-07 — End: 2018-08-08
  Administered 2018-08-07: 3 mg via INTRAVENOUS
  Filled 2018-08-07: qty 1

## 2018-08-07 MED ORDER — PROPOFOL 10 MG/ML IV EMUL (WRAP)
INTRAVENOUS | Status: AC
Start: 2018-08-07 — End: ?
  Filled 2018-08-07: qty 20

## 2018-08-07 MED ORDER — FENTANYL CITRATE (PF) 50 MCG/ML IJ SOLN (WRAP)
25.0000 ug | INTRAMUSCULAR | Status: AC | PRN
Start: 2018-08-07 — End: 2018-08-07
  Administered 2018-08-07 (×3): 25 ug via INTRAVENOUS

## 2018-08-07 MED ORDER — VENLAFAXINE HCL ER 150 MG PO CP24
225.00 mg | ORAL_CAPSULE | Freq: Every morning | ORAL | Status: DC
Start: 2018-08-07 — End: 2018-08-07
  Filled 2018-08-07 (×3): qty 1

## 2018-08-07 MED ORDER — MIDAZOLAM HCL 1 MG/ML IJ SOLN (WRAP)
INTRAMUSCULAR | Status: DC | PRN
Start: 2018-08-07 — End: 2018-08-07
  Administered 2018-08-07: 2 mg via INTRAVENOUS

## 2018-08-07 MED ORDER — KETOROLAC TROMETHAMINE 15 MG/ML IJ SOLN
15.00 mg | Freq: Four times a day (QID) | INTRAMUSCULAR | Status: DC | PRN
Start: 2018-08-07 — End: 2018-08-08
  Administered 2018-08-07 – 2018-08-08 (×3): 15 mg via INTRAVENOUS
  Filled 2018-08-07 (×4): qty 1

## 2018-08-07 MED ORDER — VENLAFAXINE HCL 75 MG PO TABS
225.0000 mg | ORAL_TABLET | Freq: Every day | ORAL | Status: DC
Start: 2018-08-07 — End: 2018-08-07

## 2018-08-07 MED ORDER — HYDROCODONE-ACETAMINOPHEN 5-325 MG PO TABS
1.0000 | ORAL_TABLET | Freq: Once | ORAL | Status: DC | PRN
Start: 2018-08-07 — End: 2018-08-07

## 2018-08-07 MED ORDER — LACTATED RINGERS IV SOLN
INTRAVENOUS | Status: DC
Start: 2018-08-07 — End: 2018-08-08

## 2018-08-07 MED ORDER — ALPRAZOLAM 0.5 MG PO TABS
1.0000 mg | ORAL_TABLET | Freq: Three times a day (TID) | ORAL | Status: DC | PRN
Start: 2018-08-07 — End: 2018-08-08
  Administered 2018-08-07 (×2): 1 mg via ORAL
  Filled 2018-08-07 (×2): qty 2

## 2018-08-07 MED ORDER — METOCLOPRAMIDE HCL 5 MG/ML IJ SOLN
10.0000 mg | Freq: Once | INTRAMUSCULAR | Status: AC | PRN
Start: 2018-08-07 — End: 2018-08-07

## 2018-08-07 MED ORDER — FENTANYL CITRATE (PF) 50 MCG/ML IJ SOLN (WRAP)
INTRAMUSCULAR | Status: AC
Start: 2018-08-07 — End: ?
  Filled 2018-08-07: qty 2

## 2018-08-07 MED ORDER — DIPHENHYDRAMINE HCL 50 MG/ML IJ SOLN
INTRAMUSCULAR | Status: AC
Start: 2018-08-07 — End: ?
  Filled 2018-08-07: qty 1

## 2018-08-07 MED ORDER — ONDANSETRON HCL 4 MG/2ML IJ SOLN
4.00 mg | Freq: Three times a day (TID) | INTRAMUSCULAR | Status: DC | PRN
Start: 2018-08-07 — End: 2018-08-07

## 2018-08-07 MED ORDER — SUCCINYLCHOLINE CHLORIDE 20 MG/ML IJ SOLN
INTRAMUSCULAR | Status: DC | PRN
Start: 2018-08-07 — End: 2018-08-07
  Administered 2018-08-07: 140 mg via INTRAVENOUS

## 2018-08-07 SURGICAL SUPPLY — 66 items
ADHESIVE LIQUID WATERPROOF VIAL PREP NONSTAIN MASTISOL STYRAX GUM (Skin Closure) ×1 IMPLANT
ADHESIVE LQ STYRAX GUM MASTIC ALC MTHY (Skin Closure) ×2 IMPLANT
APPLCATOR CHLORAPREP 26ML (Prep) ×2 IMPLANT
APPLIER IN CLP TI E-CLP2 SUP INTLK 10MM (Endoscopic Supplies) ×2
APPLIER INTERNAL CLIP L29 CM TITANIUM (Endoscopic Supplies) ×1 IMPLANT
APPLIER INTERNAL CLIP L29 CM TITANIUM PISTOL GRIP GLARE RESISTANT (Endoscopic Supplies) ×1 IMPLANT
BAG SPEC RTRVL RIPSTOP 275ML RELIACATCH (Procedure Accessories) ×2 IMPLANT
CABLE BOVIE PLUG TO STR PIN (Cable) ×2 IMPLANT
CATHETER CHLGM TAUT 4.5FR 7.5FR 18IN LF (Procedure Accessories)
CATHETER CHOLANGIOGRAM L18 IN SUPPORT (Procedure Accessories) IMPLANT
CATHETER CHOLANGIOGRAM L18 IN SUPPORT TUBE MAXIMUM RETENTION ANGLE TIP (Procedure Accessories) IMPLANT
CLEANER INST 6ML MED PREKLN SOAK SHLD (Sterilization Supply) ×2
CLEANER INSTRUMENT 6 ML SOAK SHIELD (Sterilization Supply) ×1 IMPLANT
CLEANER INSTRUMENT 6 ML SOAK SHIELD PRECLEAN TIP PROTECTION MEDIUM (Sterilization Supply) ×1 IMPLANT
DEVICE CLOSURE WECK EFX SHIELD UNIFORM (Disposable Instruments) ×1 IMPLANT
DEVICE CLSR WECK EFX SHLD UNFRM SUT (Disposable Instruments) ×2
DEVICE WECK EFX SHIELD CLOSURE UNIFORM SUTURE RETRIEVAL WING FINGER (Disposable Instruments) ×1 IMPLANT
DISSECTOR ENDOSCOPIC L38 CM 1 TIP RADIOPAQUE BRUSH FINISH BLUNT OD5 MM (Sponge) ×1 IMPLANT
DISSECTOR ESCP SS KTNR 5MM 38CM LF STRL (Sponge) ×2 IMPLANT
DRAPE 74X41IN UNIVERSAL POLY XRAY C ARM CLOSURE STRAP (Drape) IMPLANT
DRAPE EQP VLCR POLY UNV STRDRP 74X41IN (Drape) IMPLANT
DRESSING TEGADERM TRANS 3.5X4 (Dressing) ×8 IMPLANT
ELECTRODE ADULT PATIENT RETURN L9 FT REM POLYHESIVE ACRYLIC FOAM (Procedure Accessories) ×1 IMPLANT
ELECTRODE PATIENT RETURN L9 FT VALLEYLAB (Procedure Accessories) ×1 IMPLANT
ELECTRODE PT RTN RM PHSV ACRL FM C30- LB (Procedure Accessories) ×2
GLOVE SRG 7 BGL SRG LTX STRL PF BEAD CUF (Glove) ×2
GLOVE SURGICAL 7 BIOGEL SURGEONS POWDER (Glove) ×1 IMPLANT
GLOVE SURGICAL 7 BIOGEL SURGEONS POWDER FREE BEAD CUFF TEXTURE SURFACE (Glove) ×1 IMPLANT
INTRO TAUT CHOLANGIO 8F (Sheaths)
INTRODUCER CATHETER L3.5 IN ID8 FR (Sheaths) IMPLANT
INTRODUCER CATHETER L3.5 IN ID8 FR PERITONEAL TAUT (Sheaths) IMPLANT
KIT RM TURNOVER LF NS DISP (Kits) ×2 IMPLANT
KIT ROOM TURNOVER NONSTERILE LATEX FREE DISPOSABLE (Kits) ×1 IMPLANT
PACK SURGICAL GEN LAPAROSCOPY FFX (Pack) ×1 IMPLANT
SLEEVE LAPAROSCOPIC L100 MM UNIVERSAL (Procedure Accessories) ×2
SLEEVE LAPSCP UNV EPTH XCL 5MM 100MM (Procedure Accessories) ×2 IMPLANT
SLEEVE LAPSCP UNV EPTH XCL 5MM 100MM LF (Procedure Accessories) ×4
SLEEVE TROCAR L100 MM UNIVERSAL STABILITY OD5 MM ENDOPATH XCEL (Procedure Accessories) ×2 IMPLANT
SOLUTION IRR 0.9% NACL 3L URMTC LF PLS (Irrigation Solutions) ×2 IMPLANT
SOLUTION IRR 0.9% NACL 500ML LF STRL PLS (Irrigation Solutions) ×2
SOLUTION IRRIGATION 0.9% SDM CHLORIDE 500ML PR BTTL ISOTONIC NONPRGNC (Irrigation Solutions) ×1 IMPLANT
SOLUTION IRRIGATION 0.9% SODIUM CHLORIDE (Irrigation Solutions) ×1 IMPLANT
SOLUTION IRRIGATION 0.9% SODIUM CHLORIDE 3000 ML PLASTIC CONTAINER (Irrigation Solutions) ×1 IMPLANT
STRIP SKIN CLOSURE L4 IN X W1/2 IN (Dressing) ×1 IMPLANT
STRIP SKIN CLOSURE L4 IN X W1/2 IN REINFORCE STERI-STRIP POLYESTER (Dressing) ×1 IMPLANT
STRIP SKNCLS PLSTR STRSTRP 4X.5IN LF (Dressing) ×2
SUCTION IRRIGATOR 2 WITH TIP (Suction) ×2 IMPLANT
SUTURE ABS 0 UR-6 VCL 27IN BRD COAT VIOL (Suture) ×2
SUTURE ABS 4-0 P-3 MNCRL MTPS 18IN MFL (Suture) ×4
SUTURE COATED VICRYL 0 UR-6 L27 IN BRAID (Suture) ×1 IMPLANT
SUTURE MONOCRYL 4-0 P-3 L18 IN (Suture) ×2 IMPLANT
SUTURE MONOCRYL 4-0 P-3 L18 IN MONOFILAMENT UNDYED ABSORBABLE (Suture) ×2 IMPLANT
SYRINGE 10 ML GRADUATE NONPYROGENIC DEHP (Syringes, Needles) IMPLANT
SYRINGE 10 ML GRADUATE NONPYROGENIC DEHP FREE PVC FREE LOK MEDICAL (Syringes, Needles) IMPLANT
SYRINGE MED 10ML LL LF STRL GRAD N-PYRG (Syringes, Needles)
SYSTEM SMKEVC UNV PLUMEPORT ACTIV PLUME (Filter) IMPLANT
SYSTEM SMOKE EVACUATOR LAPAROSCOPIC PLUME FILTRATION LIGHTWEIGHT LUER (Filter) IMPLANT
TRAY SRG GEN LAPAROSCOPY IFMC (Pack) ×2
TROCAR LAPAROSCOPIC BLADELESS STABILITY SLEEVE L100 MM OD12 MM (Laparoscopy Supplies) ×1 IMPLANT
TROCAR LAPAROSCOPIC STABILITY SLEEVE (Laparoscopy Supplies) ×1
TROCAR LAPAROSCOPIC STABILITY SLVE (Laparoscopy Supplies) ×1
TROCAR LAPSCP EPTH XCL 12MM 100MM LF (Laparoscopy Supplies) ×2 IMPLANT
TROCAR LAPSCP EPTH XCL 5MM 100MM LF STRL (Laparoscopy Supplies) ×2 IMPLANT
TROCAR OD5 MM L100 MM BLADELESS STABLE SLEEVE ENDOPATH XCEL ENDOSCOPIC (Laparoscopy Supplies) ×1 IMPLANT
TUBING INSUFFLATION LUER CONNECTOR FILTER HIGH FLOW CLEANFLOW SU1101 (Respiratory Supplies) ×1 IMPLANT
TUBING INSUFFLATION W/CO2 GUAR (Respiratory Supplies) ×2 IMPLANT

## 2018-08-07 NOTE — Anesthesia Preprocedure Evaluation (Signed)
Anesthesia Evaluation    AIRWAY    Mallampati: II    TM distance: >3 FB  Neck ROM: full  Mouth Opening:full  Planned to use difficult airway equipment: No CARDIOVASCULAR           DENTAL    no notable dental hx     PULMONARY         OTHER FINDINGS                  Relevant Problems   ENDO   (+) Type 2 diabetes mellitus       PSS Anesthesia Comments:          Anesthesia Plan    ASA 2     general               (Discussed with patient GA with LMA/ETT as indicated with risk to include but not limited to N/V, H/A, sore throat.  Questions answered. )      intravenous induction   Detailed anesthesia plan: general endotracheal  Monitors/Adjuncts: other (BP cuff, pulse oximeter, EKG)    Post Op: other (PACU) plan for postoperative opioid use    Post op pain management: per surgeon    informed consent obtained    Plan discussed with CRNA.      pertinent labs reviewed             Signed by: Spero Geralds 08/07/18 3:31 PM

## 2018-08-07 NOTE — Transfer of Care (Signed)
Anesthesia Transfer of Care Note    Patient: Ruth [name]    Procedures performed: Procedure(s):  LAPAROSCOPIC, CHOLECYSTECTOMY    Anesthesia type: General ETT    Patient location:Phase I PACU    Last vitals:   Vitals:    08/07/18 1737   BP: 173/73   Pulse: 83   Resp: 16   Temp: 36.2 C (97.2 F)   SpO2: 93%       Post pain: Patient not complaining of pain, continue current therapy      Mental Status:awake    Respiratory Function: tolerating room air    Cardiovascular: stable    Nausea/Vomiting: patient not complaining of nausea or vomiting    Hydration Status: adequate    Post assessment: no apparent anesthetic complications, no reportable events and no evidence of recall    Signed by: Corliss Parish Mackenna Kamer  08/07/18 5:38 PM

## 2018-08-07 NOTE — Plan of Care (Signed)
Pt is NPO, hida scan done this morning, still complaining of nausea and abdominal pain. IVF infusing, on IV antibiotics, surgery is following, MRCP to be done and surgery today. Active bowel sounds, ACHS,  no insuline coverage is needed thus far. Will continue to monitor.  Problem: Pain  Goal: Pain at adequate level as identified by patient  Outcome: Progressing     Problem: Altered GI Function  Goal: Fluid and electrolyte balance are achieved/maintained  Outcome: Progressing  Flowsheets (Taken 08/07/2018 1108)  Fluid and electrolyte balance are achieved/maintained: Monitor intake and output every shift; Provide adequate hydration; Assess and reassess fluid and electrolyte status; Observe for seizure activity and initiate seizure precautions if indicated

## 2018-08-07 NOTE — Progress Notes (Signed)
S/p laparoscopic cholecystectomy. Patient transferred from PACU to room 2317B.  On transfer patient easily arousable, MAEs, VSS. Resp even, nonlabored. IV patent . Dsg to abdomen is dry and intact. Pain level tolerable. No episodes of apnea or hypopnea witnessed in PACU. POSS score 1. Family updated in PACU. ISHAPED report given to nurse at bedside.

## 2018-08-07 NOTE — Op Note (Signed)
FULL OPERATIVE NOTE    Date Time: 08/07/18 5:32 PM  Patient Name: Ruth [name] A  Attending Physician: Teodora Medici, MD      Date of Operation:   08/07/2018    Providers Performing:   Surgeon(s):  Smith Mince, MD    Assistant (s): Circulator: Michelle Piper, RN  Scrub Person: Williemae Natter, RN  First Assistant: Marin Comment    Operative Procedure:   Procedure(s):  LAPAROSCOPIC, CHOLECYSTECTOMY    Preoperative Diagnosis:   Pre-Op Diagnosis Codes:     * Acute cholecystitis [K81.0]    Postoperative Diagnosis:   Acalculous cholecystitis    Indications:   Cholecystitis with cholelithiasis    Operative Notes:   The patient was seen again in the Holding Room. The risks, benefits, complications, treatment options, and expected outcomes were discussed with the patient. The possibilities of reaction to medication, pulmonary aspiration, perforation of viscus, bleeding, recurrent infection, finding a normal gallbladder, the need for additional procedures, failure to diagnose a condition, the possible need to convert to an open procedure, and creating a complication requiring transfusion or operation were discussed with the patient. The patient and/or family concurred with the proposed plan, giving informed consent. The site of surgery properly noted/marked. The patient was taken to Operating Room, identified as [name] and the procedure verified as Laparoscopic Cholecystectomy. A Time Out was held and the above information confirmed.   Prior to the induction of general anesthesia, antibiotic prophylaxis was administered. General endotracheal anesthesia was then administered and tolerated well. After the induction, the abdomen was prepped in the usual sterile fashion. The patient was positioned in the supine position, along with some reverse Trendelenburg.   Local anesthetic agent was injected into the skin near the umbilicus and an incision made. The midline fascia was incised and optiviewport  10 mm port was  introduced under direct vision.Pneumoperitoneum was then created with CO2 and tolerated well without any adverse changes in the patient's vital signs. Additional trocars were introduced under direct vision. All skin incisions were infiltrated with a local anesthetic agent before making the incision and placing the trocars.   The gallbladder was identified, the fundus grasped and retracted cephalad. Adhesions were lysed bluntly and with the electrocautery where indicated, taking care not to injure any adjacent organs or viscus. The infundibulum was grasped and retracted laterally, exposing the peritoneum overlying the triangle of Calot. This was then divided and exposed in a blunt fashion. The cystic duct was clearly identified and bluntly dissected circumferentially. The junctions of the gallbladder, cystic duct and common bile duct were clearly identified prior to the division of any linear structure and photo documented.   The cystic duct was then doubly ligated with surgical clips on the patient side and singly clipped on the gallbladder side and divided. The cystic artery was identified, dissected free, ligated with clips and divided as well.   The gallbladder was dissected from the liver bed in retrograde fashion with the electrocautery. The gallbladder was removed. The liver bed was irrigated and inspected. Hemostasis was achieved with the electrocautery. Copious irrigation was utilized and was repeatedly aspirated until clear all particulate matter.   Pneumoperitoneum was completely reduced after viewing removal of the trocars under direct vision. The wound was thoroughly irrigated and the fascia was then closed with a figure of eight suture; the skin was then closed with monocryl and a sterile dressing was applied.    Instrument, sponge, and needle counts were correct at closure and at  the conclusion of the case.       Estimated Blood Loss:   Minimal    Specimens:       Complications:   * No complications  entered in OR log *      Signed by: Smith Mince, MD

## 2018-08-07 NOTE — Progress Notes (Signed)
Situation RUQ ABD pain, cholecystitis, UTI, DM2     Background Patient is A&Ox4 lives alone in an apartment.  She is independent w/all ADLs and does not use DME. No prior HH or SNF.     Assessment Elevated WBCs, IV fluids and ABT, possible lap chole, HIDA scan.     Recommendation DCP home w/no needs. Patient drove herself to ER. She will transport self or have a friend pick her up at d/c.          08/07/18 1157   Healthcare Decisions   Interviewed: Patient   Orientation/Decision Making Abilities of Patient Alert and Oriented x3, able to make decisions   Advance Directive Patient does not have advance directive   Healthcare Agent Appointed No   (RETIRED) Healthcare Agent's Name n/a   (RETIRED) Healthcare Agent's Phone Number n/a   Additional Emergency Contacts? n/a   Prior to admission   Prior level of function Independent with ADLs;Ambulates independently   Type of Residence Private residence   Home Layout One level   Have running water, electricity, heat, etc? Yes   Living Arrangements Alone   How do you get to your MD appointments? Self   How do you get your groceries? Self   Who fixes your meals? Self   Who does your laundry? Self   Who picks up your prescriptions? Self   Dressing Independent   Grooming Independent   Feeding Independent   Bathing Independent   Toileting Independent   Name of Prior Assisted Living Facility n/a   Home Care/Community Services None   Prior SNF admission? (Detail) n/a   Prior Rehab admission? (Detail) n/a   Adult Protective Services (APS) involved? No   Discharge Planning   Support Systems Friends/neighbors;Family members   Patient expects to be discharged to: Home   Anticipated [place] plan discussed with: Patient   Mode of transportation: Private car (family member)   Does the patient have perscription coverage? Yes   Consults/Providers   PT Evaluation Needed 2   OT Evalulation Needed 2   SLP Evaluation Needed 2   Correct PCP listed in Epic? Yes   PCP   PCP on file was verified as the  current PCP? Yes   Important Message from [facility] Notice   Patient received 1st IMM Letter? No     Darlis Loan, BSN, RN  RN Case Manager   [phone]

## 2018-08-07 NOTE — Progress Notes (Signed)
Thank you    HOSPITALIST PROGRESS NOTE      Patient: Ruth [name]  Date: 08/07/2018   LOS: 1 Days  Admission Date: 08/06/2018   MRN: [id number]  Attending: Teodora Medici  MD, MPH                    Please contact via Ruth [name]: [phone]                    Pager: [phone]     ASSESSMENT/PLAN     [name] is a 68 y.o. female admitted with RUQ abdominal pain    Interval Summary:     Active Hospital Problems    Diagnosis   . Type 2 diabetes mellitus   . Anxiety and depression   . RUQ abdominal pain       Patient Active Hospital Problem List:   RUQ abdominal pain (08/06/2018)    Assessment: HIDA scan is negative however based on ongoing pain and additional clinical symptoms along with ultrasound findings of diffuse gallbladder thickening suggestive of adenomyosis. Based on above Dr. Yisroel Ramming, general surgery plans on taking her for lap cholecystectomy w/o MRCP    Plan:   Cancel MRI   Continue with preop cefazolin  Continue n.p.o. status  Pain control with IV Toradol and p.o. Tylenol  Patient states that she has had anaphylactic reaction to codeine-containing medication and morphine this morning may have triggered a mild reaction of as well     Type 2 diabetes mellitus (08/07/2018)    Assessment: Chronic and with fingerstick glucose within normal range    Plan: We will recommend switching D5 normal saline to lactated Ringer at 75 cc  We will hold off on any long-acting insulin today  Plan on resuming half dose of Lantus-(14 units) tomorrow     Anxiety and depression (08/07/2018)    Assessment: Patient appears anxious and frustrated about not having definitive diagnosis    Plan: Reassurance provided  Home dose of Effexor were continued  Start Xanax 1 mg every 8 hours as needed    Analgesia: Toradol and Tylenol    Nutrition: N.p.o. status    GI Prophylaxis: Pepcid as needed    DVT Prophylaxis: Lovenox    Code Status: Full code    DISPO: Discharge in 1 to 2 days pending clearance from surgery        SUBJECTIVE     [name] states that she is frustrated about having to wait for 2 days and still not having an answer as to what the next steps are.  She is also frustrated about having to have yet another test and she refuses to do so. I clarified with the patient that her clinical picture is not a clear-cut case of cholecystitis and hence the need for further testing.  Nonetheless, I informed her that based on the clinical picture the general surgeon is planning on taking her to the OR later today.  And that the MRI test is not planned any more.     Patient reports feeling anxious and agitated.  She is open to trying Xanax to help take the edge off.    She would also like to speak with the surgeon before being taken down stairs for the surgery.   MEDICATIONS     Current Facility-Administered Medications   Medication Dose Route  Frequency   . ceFAZolin  1 g Intravenous Q8H   . enoxaparin  40 mg Subcutaneous Daily   . [START ON 08/08/2018] insulin glargine  14 Units Subcutaneous After breakfast   . insulin lispro  1-5 Units Subcutaneous TID AC   . venlafaxine  225 mg Oral Daily       PHYSICAL EXAM     Vitals:    08/07/18 0600   BP: 158/75   Pulse:    Resp:    Temp:    SpO2:        Temperature: Temp  Min: 97.9 F (36.6 C)  Max: 99.5 F (37.5 C)  Pulse: Pulse  Min: 75  Max: 89  Respiratory: Resp  Min: 18  Max: 18  Non-Invasive BP: BP  Min: 131/83  Max: 176/93  Pulse Oximetry SpO2  Min: 93 %  Max: 99 %    Intake and Output Summary (Last 24 hours) at Date Time    Intake/Output Summary (Last 24 hours) at 08/07/2018 1200  Last data filed at 08/07/2018 1100  Gross per 24 hour   Intake 1316.25 ml   Output 0 ml   Net 1316.25 ml       Physical Exam   Constitutional: She is oriented to person, place, and time. She appears distressed (Emotionally).   HENT:   Head: Normocephalic and atraumatic.   Right Ear: External ear normal.   Left Ear: External ear normal.   Mouth/Throat: Oropharynx is clear and moist.   Eyes: Pupils are  equal, round, and reactive to light.   Neck: Normal range of motion. Neck supple. No JVD present. No tracheal deviation present. No thyromegaly present.   Cardiovascular: Normal rate, regular rhythm, normal heart sounds and intact distal pulses. Exam reveals no gallop and no friction rub.   No murmur heard.  Pulmonary/Chest: Effort normal and breath sounds normal. No stridor. No respiratory distress. She has no wheezes. She has no rales. She exhibits no tenderness.   Abdominal: Soft. Bowel sounds are normal. She exhibits no distension and no mass. There is abdominal tenderness (Right upper quadrant). There is no rebound and no guarding.   Musculoskeletal: Normal range of motion.         General: No tenderness, deformity or edema.   Lymphadenopathy:     She has no cervical adenopathy.   Neurological: She is alert and oriented to person, place, and time. She has normal reflexes. No cranial nerve deficit. She exhibits normal muscle tone. Gait normal. Coordination normal. GCS score is 15.   Skin: Skin is warm and dry. No rash noted. She is not diaphoretic. No erythema. No pallor.   Psychiatric: Mood, memory, affect and judgment normal.   Mildly anxious and somewhat agitated and tremulous         LABS     Recent Labs   Lab 08/07/18  0326 08/06/18  1502   WBC 11.13* 10.15*   RBC 4.65 4.85   Hgb 14.2 14.9*   Hematocrit 43.6 45.1*   MCV 93.8 93.0   Platelets 297 317       Recent Labs   Lab 08/06/18  1502   Sodium 140   Potassium 4.1   Chloride 103   CO2 27   BUN 17.0   Creatinine 0.7   Glucose 120*   Calcium 9.5       Recent Labs   Lab 08/06/18  1502   ALT 24   AST (SGOT) 20   Bilirubin, Total 0.4  Albumin 4.2   Alkaline Phosphatase 111*                   Microbiology Results     Procedure Component Value Units Date/Time    COVID-19 (SARS-CoV-2) Verne [facility] Rapid) [[id number]] Collected:  08/06/18 1854    Specimen:  Nasopharyngeal Swab Updated:  08/06/18 2037     SARS-CoV-2 Specimen Source Nasopharyngeal     SARS CoV 2 Overall  Result Negative     Comment: Test performed using the Abbott ID NOW EUA assay.  Please see Fact Sheets for patients and providers located at:  http://www.rice.biz/  This test is for the qualitative detection of SARS-CoV-2  (COVID-19)nucleic acid. Viral nucleic acids may persist in vivo,  independent of viability. Detection of viral nucleic acid does  not imply the presence of infectious virus, or that virus  nucleic acid is the cause of clinical symptoms.  Negative results do not preclude SARS-CoV-2 infection and  should not be used as the sole basis for patient management  decisions.  Invalid results may be due to inhibiting substances in the  specimen and recollection should occur.         Narrative:       o Collect and clearly label specimen type:  o Upper respiratory specimen: One Nasopharyngeal Dry Swab NO  Transport Media.  o Hand deliver to laboratory ASAP    Urine culture [[id number]] Collected:  08/06/18 1502    Specimen:  Urine, Clean Catch Updated:  08/06/18 1902    Narrative:       Replace urinary catheter prior to obtaining the urine culture  if it has been in place for greater than or equal to 14  days:->N/A No Foley  Indications for Urine Culture:->Suprapubic Pain/Tenderness or  Dysuria           Nm Hepatobiliary    Result Date: 08/07/2018   No evidence of acute cholecystitis. Laurena Slimmer, MD 08/07/2018 10:37 AM    Ct Abd/pelvis With Iv Contrast Only    Result Date: 08/06/2018   1. Multiple prominent mesenteric lymph nodes and hazy infiltration of the fat in the root of the mesentery. 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 Other differential consideration including mesenteric lymphoma, inflammatory bowel disease, idiopathic, and mesenteric venous thrombosis are considered to be less likely. Aldean Ast, MD 08/06/2018 4:02 PM    US Abdomen Limited Ruq    Result Date: 08/06/2018  1. Small amount of sludge in the gallbladder 2. Diffuse gallbladder wall thickening with  ringdown artifact in a pattern often associated with adenomyosis. If the patient has acute right upper quadrant pain it would be difficult to exclude superimposed acute or chronic cholecystitis as we have no prior sonograms available for comparison. If there is clinical concern for acute cholecystitis hepatobiliary scan with morphine may be a useful adjunctive imaging study 3. No biliary ductal obstruction 4. Fatty liver Laurena Slimmer, MD 08/06/2018 4:14 PM      Echo Results     None          I have personally reviewed the patient's labs, medications, and imaging    Total visit time = 45 mins ; more than 50% spent counseling/coordinating care    Signed,    Teodora Medici MD, MPH  12:00 PM 08/07/2018   Please contact via PerfectServe  SpectraLink: [phone]  Pager: [phone]       This chart was generated using a medical voice-recognition software which does not employ  spell-checking or grammar-checking features. It was dictated, all or in part, in a busy and often noisy patient care environment. I have taken all usual measures to dictate carefully and to review all aspects of this chart. Nonetheless, given the known and well-documented performance characteristics of voice recognition software in such patient care environments, this dictation still may contain unrecognized and wholly unintended errors or omissions.

## 2018-08-07 NOTE — UM Notes (Signed)
UTILIZATION REVIEW CONTACT: Name: Carole Civil RN   Clinical Case Manager  - Utilization Review  [facility]  Address:[address]    NPI:   [id number]  Tax ID:  [id number]  Phone: [phone]  Fax: [phone]  Email: [email or web address]      PATIENT NAME: Ruth [name]  Patient Address:  [address]  [place] [address]  DOB: 04/25/1950   MR#: [id number]    ADMIT ORDER:INPATIENT 04/30@1750     ADMISSION REVIEW     History of present illness: Pt is a 68 y.o. female arrived at [facility] on 08/06/2018 at 1411.      VS:   Vitals:    08/07/18 1345   BP: 143/77   Pulse: 72   Resp: 17   Temp: 97.9 F (36.6 C)   SpO2: 93%       Medications:   Scheduled Meds:  Current Facility-Administered Medications   Medication Dose Route Frequency   . ceFAZolin  1 g Intravenous Q8H   . docusate sodium  100 mg Oral Q12H SCH   . enoxaparin  40 mg Subcutaneous Daily   . [START ON 08/08/2018] insulin glargine  14 Units Subcutaneous After breakfast   . insulin lispro  1-5 Units Subcutaneous TID AC   . venlafaxine  225 mg Oral QAM W/BREAKFAST     Continuous Infusions:  . lactated ringers 75 mL/hr at 08/07/18 1254     PRN Meds:.acetaminophen, ALPRAZolam, famotidine, hydrALAZINE, ketorolac, melatonin, morphine, naloxone, ondansetron **OR** ondansetron    Order Dose Route Action   08/06/2018 1534 sodium chloride 0.9 % bolus 1,000 mL 1,000 mL Intravenous   08/06/2018 1550 iohexol (OMNIPAQUE) 350 MG/ML injection 100 mL 100 mL Intravenous   08/06/2018 1717 ceFAZolin (ANCEF) 2 g in dextrose 5 % 50 mL IVPB 2 g Intravenous   08/06/2018 1709 dextrose 25% (D25W) bolus (PEDS) 132 mL  Intravenous   08/06/2018 1713 dextrose 25% (D25W) bolus (PEDS) 132 mL  Intravenous   08/06/2018 1715 dextrose (D10W) 10% bolus 250 mL 250 mL Intravenous   08/06/2018 1717 dextrose 5 % and 0.45 % NaCl infusion  Intravenous     HISTORY AND PHYSICAL-  68 y.o. female who presents to the hospital with right sided abdominal pain consistent with  biliary source,    US Abdomen Limited RUQ   Collected:  08/06/18 1612   History: ABDOMINAL PAIN    Findings: The gallbladder is moderately thick walled. Small amount of  sludge is seen in the gallbladder. No intrahepatic biliary ductal  dilatation is seen. Common bile duct is 3 mm in diameter moderate ring  down artifact is seen in the gallbladder wall. Incidental note is made  of fatty infiltration of the liver     Impression:        1. Small amount of sludge in the gallbladder  2. Diffuse gallbladder wall thickening with ringdown artifact in a  pattern often associated with adenomyosis. If the patient has acute  right upper quadrant pain it would be difficult to exclude superimposed  acute or chronic cholecystitis as we have no prior sonograms available  for comparison. If there is clinical concern for acute cholecystitis  hepatobiliary scan with morphine may be a useful adjunctive imaging  study  3. No biliary ductal obstruction  4. Fatty liver    Laurena Slimmer, MD   08/06/2018 4:14 PM     CT Abd/Pelvis with IV Contrast only  Collected:  08/06/18 1553   Impression:  1. Multiple prominent mesenteric lymph nodes and hazy infiltration of  the fat in the root of the mesentery. 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 Other differential consideration  including mesenteric lymphoma, inflammatory bowel disease, idiopathic,  and mesenteric venous thrombosis are considered to be less likely. Aldean Ast, MD   08/06/2018 4:02 PM      Assessment:   Right sided abdominal pain  1. Small amount of sludge in the gallbladder  2. Diffuse gallbladder wall thickening with ringdown artifact in a  pattern often associated with adenomyosis. If the patient has acute  right upper quadrant pain it would be difficult to exclude superimposed  acute or chronic cholecystitis as we have no prior sonograms available  for comparison. If there is clinical concern for acute cholecystitis  hepatobiliary scan with  morphine may be a useful adjunctive imaging  study  3. No biliary ductal obstruction  4. Fatty liver      Plan:   HIDA scan in am to confirm cholecystitis  Likely acute on chronic given appearance on [place]  Plan laparoscopic cholecystectomy   IV antibiotics    Signed by: Gunnar Fusi RN  Utilization Review Case Manager  Case Management  [facility] [place]   [address]  [place], [place]  [address]  T [phone]  F [phone]

## 2018-08-07 NOTE — Brief Op Note (Signed)
BRIEF OP NOTE    Date Time: 08/07/18 5:32 PM    Patient Name:   Ruth [name]    Date of Operation:   08/07/2018    Providers Performing:   Surgeon(s):  Smith Mince, MD    Assistant (s):   Circulator: Michelle Piper, RN  Scrub Person: Williemae Natter, RN  First Assistant: Marin Comment    Operative Procedure:   Procedure(s):  LAPAROSCOPIC, CHOLECYSTECTOMY    Preoperative Diagnosis:   Pre-Op Diagnosis Codes:     * Acute cholecystitis [K81.0]    Postoperative Diagnosis:   Acalculous cholecystitis    Anesthesia:   General    Estimated Blood Loss:   Minimal.    Implants:   * No implants in log *    Specimens:       Findings:   Acalculous cholecystitis    Complications:   None.      Signed by: Smith Mince, MD                                                                           [facility]

## 2018-08-07 NOTE — Progress Notes (Signed)
Pt admitted to room 2317B due to complaint of RUQ abdominal pain. Introduced myself and oriented pt to room, whiteboard, call bell and [facility]. Personal belongings documented, call bell at bedside. VSS, pt AAOx4. Pt denies pain and nausea at this time, availability updated appropriately. All questions and concerns addressed. MD notified of Pts arrival to unit. Pt refused percocet because she thinks it is part of codeine. Pt educated on her medicine. Rash development on RUA, mild, no itching. Informed Day shift nurse and pt to share it with doctor. BP was elevated at once. But came down after recheck. IV fluid, IV antibiotics and IV fluid administered. Heat packs given for pain management. NPO since Midnight.  Updated information on whiteboard and will continue to monitor pt.

## 2018-08-07 NOTE — Anesthesia Postprocedure Evaluation (Signed)
Anesthesia Post Evaluation    Patient: Ruth [name]    Procedure(s):  LAPAROSCOPIC, CHOLECYSTECTOMY    Anesthesia type: general    Last Vitals:   Vitals Value Taken Time   BP 131/62 08/07/2018  6:15 PM   Temp  08/07/2018  6:51 PM   Pulse 94 08/07/2018  6:45 PM   Resp 17 08/07/2018  6:45 PM   SpO2 99 % 08/07/2018  6:45 PM                 Anesthesia Post Evaluation:     Patient Evaluated: PACU  Patient Participation: complete - patient participated  Level of Consciousness: awake and alert    Pain Management: adequate  Multimodal analgesia pain management approach    Airway Patency: patent    Anesthetic complications: No      PONV Status: none    Cardiovascular status: acceptable and hemodynamically stable  Respiratory status: acceptable and room air  Hydration status: acceptable  Comments: No apparent anesthetic complications or reportable events        Signed by: Spero Geralds, 08/07/2018 6:51 PM

## 2018-08-07 NOTE — Progress Notes (Signed)
Pt is still in the OR for lap chole

## 2018-08-08 DIAGNOSIS — K810 Acute cholecystitis: Secondary | ICD-10-CM | POA: Diagnosis present

## 2018-08-08 LAB — CBC
Absolute NRBC: 0 10*3/uL (ref 0.00–0.00)
Hematocrit: 39.2 % (ref 34.7–43.7)
Hgb: 12.9 g/dL (ref 11.4–14.8)
MCH: 31 pg (ref 25.1–33.5)
MCHC: 32.9 g/dL (ref 31.5–35.8)
MCV: 94.2 fL (ref 78.0–96.0)
MPV: 8.4 fL — ABNORMAL LOW (ref 8.9–12.5)
Nucleated RBC: 0 /100 WBC (ref 0.0–0.0)
Platelets: 251 10*3/uL (ref 142–346)
RBC: 4.16 10*6/uL (ref 3.90–5.10)
RDW: 14 % (ref 11–15)
WBC: 9.54 10*3/uL — ABNORMAL HIGH (ref 3.10–9.50)

## 2018-08-08 LAB — BASIC METABOLIC PANEL
Anion Gap: 7 (ref 5.0–15.0)
BUN: 7 mg/dL (ref 7.0–19.0)
CO2: 29 mEq/L (ref 22–29)
Calcium: 8.5 mg/dL (ref 8.5–10.5)
Chloride: 105 mEq/L (ref 100–111)
Creatinine: 0.7 mg/dL (ref 0.6–1.0)
Glucose: 133 mg/dL — ABNORMAL HIGH (ref 70–100)
Potassium: 4.4 mEq/L (ref 3.5–5.1)
Sodium: 141 mEq/L (ref 136–145)

## 2018-08-08 LAB — ECG 12-LEAD
Atrial Rate: 79 {beats}/min
P Axis: 50 degrees
P-R Interval: 122 ms
Q-T Interval: 396 ms
QRS Duration: 82 ms
QTC Calculation (Bezet): 454 ms
R Axis: 13 degrees
T Axis: 52 degrees
Ventricular Rate: 79 {beats}/min

## 2018-08-08 LAB — HEMOLYSIS INDEX: Hemolysis Index: 2 (ref 0–18)

## 2018-08-08 LAB — GLUCOSE WHOLE BLOOD - POCT
Whole Blood Glucose POCT: 131 mg/dL — ABNORMAL HIGH (ref 70–100)
Whole Blood Glucose POCT: 177 mg/dL — ABNORMAL HIGH (ref 70–100)
Whole Blood Glucose POCT: 167 mg/dL — ABNORMAL HIGH (ref 70–100)

## 2018-08-08 LAB — GFR: EGFR: >60

## 2018-08-08 MED ORDER — ACETAMINOPHEN 325 MG PO TABS
650.00 mg | ORAL_TABLET | ORAL | Status: AC | PRN
Start: 2018-08-08 — End: ?

## 2018-08-08 MED ORDER — TRAMADOL HCL 50 MG PO TABS
50.00 mg | ORAL_TABLET | Freq: Four times a day (QID) | ORAL | 0 refills | Status: AC | PRN
Start: 2018-08-08 — End: 2018-08-15

## 2018-08-08 MED ORDER — IBUPROFEN 600 MG PO TABS
600.00 mg | ORAL_TABLET | Freq: Four times a day (QID) | ORAL | 0 refills | Status: AC | PRN
Start: 2018-08-08 — End: ?

## 2018-08-08 MED ORDER — KETOROLAC TROMETHAMINE 10 MG PO TABS
10.0000 mg | ORAL_TABLET | Freq: Four times a day (QID) | ORAL | 0 refills | Status: AC | PRN
Start: 2018-08-08 — End: ?

## 2018-08-08 MED ORDER — IBUPROFEN 600 MG PO TABS
600.0000 mg | ORAL_TABLET | Freq: Four times a day (QID) | ORAL | Status: DC | PRN
Start: 2018-08-08 — End: 2018-08-08
  Administered 2018-08-08: 11:00:00 600 mg via ORAL
  Filled 2018-08-08 (×2): qty 1

## 2018-08-08 MED ORDER — KETOROLAC TROMETHAMINE 30 MG/ML IJ SOLN
30.00 mg | Freq: Once | INTRAMUSCULAR | Status: DC
Start: 2018-08-08 — End: 2018-08-08

## 2018-08-08 MED ORDER — ONDANSETRON 4 MG PO TBDP
4.00 mg | ORAL_TABLET | Freq: Four times a day (QID) | ORAL | 0 refills | Status: AC | PRN
Start: 2018-08-08 — End: ?

## 2018-08-08 MED ORDER — ACETAMINOPHEN 325 MG PO TABS
650.0000 mg | ORAL_TABLET | Freq: Four times a day (QID) | ORAL | Status: DC
Start: 2018-08-08 — End: 2018-08-08

## 2018-08-08 MED ORDER — TRAMADOL HCL 50 MG PO TABS
25.0000 mg | ORAL_TABLET | Freq: Once | ORAL | Status: DC
Start: 2018-08-08 — End: 2018-08-08

## 2018-08-08 NOTE — UM Notes (Addendum)
UTILIZATION REVIEW CONTACT : Eddie North BSN, RN, ACM-RN  Utilization Review Case Manager II / Case Management  [facility]  [address]  [place], [place] [address]  T [phone] Judie Petit [phone]  F [phone]   Email: [email or web address]  NPI: [id number] Tax ID:  [id number]      Place (admit) for Observation Services (Order [id number]) 08/07/18 1401  Admitting Physician Lyn Hollingshead, JUDE R    Diagnosis RUQ abdominal pain    Estimated Length of Stay < 2 midnights    Tentative Discharge Plan? Home or Self Care    Patient Class Observation        PATIENT NAME: Ruth [name],Ruth [name]  DOB: 10/19/1950      CONCURRENT REVIEW  History of present illness: Pt is [name] 68 y.o. female arrived at [facility] on 08/06/2018 at 1411.and admitted to  23N    Arrival VS: Vitals BP: 143/85, Temp: 97.9 F (36.6 C), Temp Source: Oral, Heart Rate: 82, Resp Rate: 18, SpO2: 99 %, Height: 157.5 cm (5\' 2" ), Weight: 65.8 kg (145 lb)      BP 119/67   Pulse 95   Temp 98.8 F (37.1 C) (Oral)   Resp 18   Ht 1.575 m (5\' 2" )   Wt 72 kg (158 lb 12.8 oz)   SpO2 95%   BMI 29.04 kg/m     Temp:  [97.2 F (36.2 C)-99.9 F (37.7 C)]   Heart Rate:  [72-112]   Resp Rate:  [16-18]   BP: (103-173)/(61-77)   SpO2:  [91 %-99 %]   Weight:  [72 kg (158 lb 12.8 oz)]     Last recorded pain score:  Pain Scale Used: Numeric Scale (0-10)  Pain Score: 5-moderate pain     Abnormal Labs:   Lab Results last 48 Hours     Procedure Component Value Units Date/Time    Glucose Whole Blood - POCT [[id number]]  (Abnormal) Collected:  08/08/18 1215     Updated:  08/08/18 1219     POCT - Glucose Whole blood 167 mg/dL     Glucose Whole Blood - POCT [[id number]]  (Abnormal) Collected:  08/08/18 0801     Updated:  08/08/18 0831     POCT - Glucose Whole blood 177 mg/dL     Basic Metabolic Panel [[id number]]  (Abnormal) Collected:  08/08/18 0413    Specimen:  Blood Updated:  08/08/18 0513     Glucose 133 mg/dL     CBC without differential [[id number]]  (Abnormal)  Collected:  08/08/18 0413    Specimen:  Blood Updated:  08/08/18 0456     WBC 9.54 x10 3/uL      MPV 8.4 fL     Glucose Whole Blood - POCT [[id number]]  (Abnormal) Collected:  08/08/18 0421     Updated:  08/08/18 0423     POCT - Glucose Whole blood 131 mg/dL     Urine culture [[id number]] Collected:  08/06/18 1502    Specimen:  Urine, Clean Catch Updated:  08/07/18 1946    Narrative:       Replace urinary catheter prior to obtaining the urine culture  if it has been in place for greater than or equal to 14  days:->N/[name] No Foley  Indications for Urine Culture:->Suprapubic Pain/Tenderness or  Dysuria  ORDER#: [id number]  ORDERED BY: MORTIERE, MICHA  SOURCE: Urine, Clean Catch                           COLLECTED:  08/06/18 15:02  ANTIBIOTICS AT COLL.:                                RECEIVED :  08/06/18 19:02  Culture Urine                              PRELIM      08/07/18 19:46   +  08/07/18   >100,000 CFU/ML Escherichia coli               Further workup to follow including susceptibility testing        Glucose Whole Blood - POCT [[id number]]  (Abnormal) Collected:  08/07/18 1157     Updated:  08/07/18 1218     POCT - Glucose Whole blood 139 mg/dL     Glucose Whole Blood - POCT [[id number]]  (Abnormal) Collected:  08/07/18 0823     Updated:  08/07/18 0833     POCT - Glucose Whole blood 140 mg/dL     CBC without differential [[id number]]  (Abnormal) Collected:  08/07/18 0326    Specimen:  Blood Updated:  08/07/18 0622     WBC 11.13 x10 3/uL      MPV 8.6 fL     Comprehensive metabolic panel [[id number]]  (Abnormal) Collected:  08/06/18 1502    Specimen:  Blood Updated:  08/06/18 1530     Glucose 120 mg/dL      Alkaline Phosphatase 111 U/L     Urinalysis with microscopic (pts < 3 yrs) [[id number]]  (Abnormal) Collected:  08/06/18 1502     Leukocyte Esterase, UA Small     Nitrite, UA Positive     WBC, UA 11 - 25 /hpf     CBC and differential [[id number]]  (Abnormal) Collected:  08/06/18 1502     Specimen:  Blood Updated:  08/06/18 1513     WBC 10.15 x10 3/uL      Hgb 14.9 g/dL      Hematocrit 27.2 %      MPV 8.2 fL      Neutrophils Absolute 6.93 x10 3/uL           MD Notes:  Surgery note 08/08/2018  S/p lap cholecystectomy for acalculous cholecystectomy likely due to sludge and microlithiasis  Diet as tolerated  Pain medications managed  Surgically stable for discharge    Pt discharged 08/08/2018    Medications: Scheduled Meds:  Current Facility-Administered Medications   Medication Dose Route Frequency   . acetaminophen  650 mg Oral 4 times per day   . ceFAZolin  1 g Intravenous Q8H   . docusate sodium  100 mg Oral Q12H SCH   . enoxaparin  40 mg Subcutaneous Daily   . insulin glargine  14 Units Subcutaneous After breakfast   . insulin lispro  1-5 Units Subcutaneous TID AC   . venlafaxine  150 mg Oral QAM W/BREAKFAST     Continuous Infusions:  . lactated ringers Stopped (08/07/18 1926)     PRN Meds:.acetaminophen, Alprazolam, famotidine, hydralazine, ibuprofen, ketorolac, melatonin, morphine, naloxone, ondansetron **OR** ondansetron

## 2018-08-08 NOTE — Progress Notes (Signed)
A+Ox4. S/p lap chole, 1 person assist to bathroom during night. 4 lap sites CDI w minimal drainage. C/o pain to lap sites, given toradol prn. On IV abx and IVF. Pt felt shaky this am, checked BG- 131. Able to tolerate juice without nausea. Will continue to monitor.

## 2018-08-08 NOTE — Discharge Instr - AVS First Page (Addendum)
[facility] DISCHARGE INSTRUCTIONS     Date of Admission: 08/06/2018    Date of Discharge: 08/08/2018      Consultants: General surgery    Pending Studies: None    Further Instructions: Please follow up with the providers/clinics listed below.     Surgery/Procedure: Nm Hepatobiliary    Result Date: 08/07/2018   No evidence of acute cholecystitis. Laurena Slimmer, MD 08/07/2018 10:37 AM    Ct Abd/pelvis With Iv Contrast Only    Result Date: 08/06/2018   1. Multiple prominent mesenteric lymph nodes and hazy infiltration of the fat in the root of the mesentery. 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 Other differential consideration including mesenteric lymphoma, inflammatory bowel disease, idiopathic, and mesenteric venous thrombosis are considered to be less likely. Aldean Ast, MD 08/06/2018 4:02 PM    US Abdomen Limited Ruq    Result Date: 08/06/2018  1. Small amount of sludge in the gallbladder 2. Diffuse gallbladder wall thickening with ringdown artifact in a pattern often associated with adenomyosis. If the patient has acute right upper quadrant pain it would be difficult to exclude superimposed acute or chronic cholecystitis as we have no prior sonograms available for comparison. If there is clinical concern for acute cholecystitis hepatobiliary scan with morphine may be a useful adjunctive imaging study 3. No biliary ductal obstruction 4. Fatty liver Laurena Slimmer, MD 08/06/2018 4:14 PM    Admission Diagnosis: Cholecystitis, acute [K81.0]  RUQ abdominal pain [R10.11]  Lower urinary tract infection, acute [N39.0]  Type 2 diabetes mellitus without complication, with long-term current use of insulin [E11.9, Z79.4]    Problem List  Active Hospital Problems    Diagnosis   . Cholecystitis, acute s/p lap cholecystectomy on 08/07/2018   . Type 2 diabetes mellitus   . Anxiety and depression          Discharge Medications     Medication List      START taking these medications    acetaminophen 325 MG  tablet  Commonly known as:  TYLENOL  Take 2 tablets (650 mg total) by mouth every 4 (four) hours as needed for Pain Maximum dose of acetaminophen is 4000 mg from all sources in 24 hours     ibuprofen 600 MG tablet  Commonly known as:  ADVIL,MOTRIN  Take 1 tablet (600 mg total) by mouth every 6 (six) hours as needed for Fever (pain 5-8)     ondansetron 4 MG disintegrating tablet  Commonly known as:  ZOFRAN-ODT  Take 1 tablet (4 mg total) by mouth every 6 (six) hours as needed for Nausea        CONTINUE taking these medications    glimepiride 4 MG tablet  Commonly known as:  AMARYL     HumaLOG KwikPen 100 UNIT/ML Sopn injection pen  Generic drug:  insulin lispro (1 Unit Dial)     insulin glargine 100 UNIT/ML injection pen     metFORMIN 1000 MG tablet  Commonly known as:  GLUCOPHAGE     Trulicity 0.75 MG/0.5ML Sopn  Generic drug:  Dulaglutide     venlafaxine 75 MG tablet  Commonly known as:  EFFEXOR           Where to Get Your Medications      These medications were sent to [facility], [place] - [address]  [address] [place] [address]    Phone:  [phone]    ibuprofen 600  MG tablet   ondansetron 4 MG disintegrating tablet     Information about where to get these medications is not yet available    Ask your nurse or doctor about these medications   acetaminophen 325 MG tablet         Follow up Appointments  Follow-up Information     Joice Lofts, MD. Schedule an appointment as soon as possible for a visit in 1 week(s).    Specialty:  Internal Medicine  Why:  primary care follow up after discharge   Contact information:  [address]  [address]  [place] [address]  [phone]             Smith Mince, MD. Schedule an appointment as soon as possible for a visit in 1 week(s).    Specialty:  Surgery  Why:  post-op follow up   Contact information:  [address]  [address] [place] [address]  [phone]                     Call Your Doctor if you have:   Chest pain    Shortness of breath or difficulty breathing   Temperature greater than 100.4   Persistent nausea and vomiting   Severe uncontrolled pain   Signs of infection (pain, swelling, redness, tenderness, odor, or green/yellow discharge)   Headache or visual disturbances   Hives   Persistent dizziness or light-headedness   Extreme fatigue   Bleeding   Any other questions or concerns you may have after discharge   In an emergency, call 911 or go to an Emergency Department at a nearby hospital     Additional Instructions:   -Schedule a follow up appointment with your Primary Care Provider within 1 week of discharge.   -Carry a list of your medications and allergies with you at all times   -Resume your usual diet unless otherwise directed. Good nutrition promotes quicker healing.   -Call your pharmacy at least 1 week in advance to refill prescriptions     Information on Taking Medicine Safely   Medicine is given to help treat or prevent illness. But if you don't take it correctly, it might not help. It might even harm you. Your doctor or pharmacist can help you learn the right way to take your medicine. Listed below are some tips to help you take medicine safely.     Safety Tips   1. Have a routine for taking each medicine. Make it part of something you do each day, such as brushing your teeth or eating a meal.   2. When you go to the hospital or your doctor's office, bring all your current medicines in their original boxes or bottles. If you can't do that, bring an up-to-date list of your medicines.   3. Do not stop taking a prescription medicine unless your doctor tells you to. Doing so could make your condition worse.   4. Do not share medicines.   5. Let your doctor and pharmacist know of any allergies you have.   6. Taking prescription medicines with alcohol, street drugs, herbs, supplements, or even some over-the-counter medicines can be harmful. Talk to your doctor or pharmacist before using any of these things while  taking a prescription medcine.   7. When filling your prescriptions, try using the same pharmacy for all your medicines. If not, let the pharmacist know what medicines you are already taking.   8. Keep medicines out  of the reach of children and pets. Store medicines in a cool, dry, dark place--not in the bathroom or in the kitchen near moisture or heat.   9. Do not use medicine that has expired or that doesn't look or smell right. Bring expired or old medicine to your pharmacy for disposal.     Using Generic Medicines   Medicines have brand names and generic (chemical) names. When a medicine is first made, it is sold only under its brand name. Later, it can be made and sold as a generic. Generic medicines cost less than brand-name medicines and most work just as well. Unless their doctor says otherwise, most people can use the generic medicine instead of the brand-name medicine.     Always follow your healthcare professional's instructions.     If you are unable to obtain an appointment, unable to obtain newly prescribed medications, or are unclear about any of your discharge instructions please contact me at [phone] (M-F, 8am-3pm) or weekends and after hours via the hospital operator [phone], the hospital case manager, or your primary care physician.    Finally, as your discharging physician, you may be receiving a survey which is regarding my care. I would greatly value and appreciate your feedback as I strive for excellence.     Respectfully yours,    Teodora Medici MD, MPH  [facility]  [facility]

## 2018-08-08 NOTE — Discharge Summary (Signed)
[facility]      Patient: Ruth [name]  Admission Date: 08/06/2018   DOB: 10-Mar-1951  Discharge Date: 08/08/2018    MRN: [id number]  Discharge Attending:Tosha Belgarde   Referring Physician: Joice Lofts, MD  PCP: Joice Lofts, MD       DISCHARGE SUMMARY     Discharge Information   Admission Diagnosis:   Cholecystitis, acute    Discharge Diagnosis:   Active Hospital Problems    Diagnosis   . Cholecystitis, acute s/p lap cholecystectomy on 08/07/2018   . Type 2 diabetes mellitus   . Anxiety and depression        Admission Condition: abdominal pain  Discharge Condition: improved  Consultants: general surgery  Functional Status: ambulatory  Discharged to: home    Discharge Medications:     Medication List      START taking these medications    acetaminophen 325 MG tablet  Commonly known as:  TYLENOL  Take 2 tablets (650 mg total) by mouth every 4 (four) hours as needed for Pain Maximum dose of acetaminophen is 4000 mg from all sources in 24 hours     ibuprofen 600 MG tablet  Commonly known as:  ADVIL,MOTRIN  Take 1 tablet (600 mg total) by mouth every 6 (six) hours as needed for Fever (pain 5-8)     ondansetron 4 MG disintegrating tablet  Commonly known as:  ZOFRAN-ODT  Take 1 tablet (4 mg total) by mouth every 6 (six) hours as needed for Nausea        CONTINUE taking these medications    glimepiride 4 MG tablet  Commonly known as:  AMARYL     HumaLOG KwikPen 100 UNIT/ML Sopn injection pen  Generic drug:  insulin lispro (1 Unit Dial)     insulin glargine 100 UNIT/ML injection pen     metFORMIN 1000 MG tablet  Commonly known as:  GLUCOPHAGE     Trulicity 0.75 MG/0.5ML Sopn  Generic drug:  Dulaglutide     venlafaxine 75 MG tablet  Commonly known as:  EFFEXOR           Where to Get Your Medications      These medications were sent to [facility], [place] - [address]  [address] [place], [place] [address]    Phone:  [phone]    ibuprofen 600 MG tablet   ondansetron  4 MG disintegrating tablet     Information about where to get these medications is not yet available    Ask your nurse or doctor about these medications   acetaminophen 325 MG tablet              Hospital Course   Presentation History   Per H&P "Ruth [name] is a 67 y.o. female with a PMHx of type II diabetes who presented with abd pain x 2 days in the RUQ.  Pain was moderate to severe and associated with one episode of N/V.  Had poor appetite and PO intake during this time.  Symptoms worsened without cause and she came to the ED.  No CP, fever, diaphoresis, chills, SOB, dysuria.  No prior history of similar symptoms, or prior gallstone disease."    See HPI for details.    Hospital Course (0 Days)     RUQ abdominal pain (08/06/2018)    Assessment: HIDA scan is negative however based on ongoing pain and additional clinical symptoms along with ultrasound findings of  diffuse gallbladder thickening she underwent lap chole. Intraop finding was consistent with acute cholecystitis.     Plan:   Cleared for discharge   Low fat diet  Pain control with motrin and tylenol  Patient states that she has had anaphylactic reaction to codeine-containing medication and morphine in-house may have triggered a mild reaction of as well. Will avoid all narcotics at discharge  Zofran prn nausea  Incentive spirometry        Type 2 diabetes mellitus (08/07/2018)    Assessment: Chronic and with fingerstick glucose within normal range    Plan: We will recommend switching D5 normal saline to lactated Ringer at 75 cc  We will hold off on any long-acting insulin today  Plan on resuming full dose basaglar 28 U q AM tomorrow      Anxiety and depression (08/07/2018)    Assessment: Patient appears anxious and frustrated about not having definitive diagnosis    Plan: Reassurance provided  Home dose of Effexor were continued  Benefited from Xanax 1 mg every 8 hours in-house. No rx given. Recommended f/u with PMD/Psych       Best Practices   Was the  patient admitted with either a CHF Exacerbation or Pneumonia? No     Progress Note/Physical Exam at Discharge     Subjective:  Reports pain of 6/10. No gas yet. Eating well.     Vitals:    08/07/18 2017 08/08/18 0108 08/08/18 0423 08/08/18 0804   BP: 128/70 103/61 109/62 122/70   Pulse: (!) 103 (!) 112 (!) 101 (!) 101   Resp: 16 16 16 18    Temp: 98.2 F (36.8 C) 98.8 F (37.1 C) 98.4 F (36.9 C) 99.9 F (37.7 C)   TempSrc: Oral Oral Oral Oral   SpO2: 95% 93% 95% 92%   Weight:  72 kg (158 lb 12.8 oz)     Height:           Physical Exam  Constitutional: She is oriented to person, place, and time. She appears well-developed. No distress.   HENT:    Head: Normocephalic and atraumatic.   Eyes: EOM are normal. Pupils are equal, round, and reactive to light.   Neck: Normal range of motion. Neck supple. No tracheal deviation present. No thyromegaly present.   Cardiovascular: Normal rate, regular rhythm, normal heart sounds and intact distal pulses.    No murmur heard.  Pulmonary/Chest: Effort normal and breath sounds normal. No respiratory distress.   Abdominal: Soft. Bowel sounds are normal. Normal bowel sounds. Surgical sites covered with dressing. Mildly tender to touch.. Has dried blood on it. Sites clean, dry and intact.  Musculoskeletal: Normal range of motion.   Neurological: She is alert and oriented to person, place, and time.   Skin: Skin is warm and dry. She is not diaphoretic.   Psychiatric: She has a normal mood and affect. Her behavior is normal. Judgment and thought content normal.              Diagnostics     Labs/Studies Pending at Discharge: yes- gall bladder biospy    Last Labs   Recent Labs   Lab 08/08/18  0413 08/07/18  0326 08/06/18  1502   WBC 9.54* 11.13* 10.15*   RBC 4.16 4.65 4.85   Hgb 12.9 14.2 14.9*   Hematocrit 39.2 43.6 45.1*   MCV 94.2 93.8 93.0   Platelets 251 297 317       Recent Labs   Lab 08/08/18  0413  08/06/18  1502   Sodium 141 140   Potassium 4.4 4.1   Chloride 105 103   CO2 29 27    BUN 7.0 17.0   Creatinine 0.7 0.7   Glucose 133* 120*   Calcium 8.5 9.5       Microbiology Results     Procedure Component Value Units Date/Time    COVID-19 (SARS-CoV-2) [facility] Rapid) [[id number]] Collected:  08/06/18 1854    Specimen:  Nasopharyngeal Swab Updated:  08/06/18 2037     SARS-CoV-2 Specimen Source Nasopharyngeal     SARS CoV 2 Overall Result Negative     Comment: Test performed using the Abbott ID NOW EUA assay.  Please see Fact Sheets for patients and providers located at:  http://www.rice.biz/  This test is for the qualitative detection of SARS-CoV-2  (COVID-19)nucleic acid. Viral nucleic acids may persist in vivo,  independent of viability. Detection of viral nucleic acid does  not imply the presence of infectious virus, or that virus  nucleic acid is the cause of clinical symptoms.  Negative results do not preclude SARS-CoV-2 infection and  should not be used as the sole basis for patient management  decisions.  Invalid results may be due to inhibiting substances in the  specimen and recollection should occur.         Narrative:       o Collect and clearly label specimen type:  o Upper respiratory specimen: One Nasopharyngeal Dry Swab NO  Transport Media.  o Hand deliver to laboratory ASAP    Urine culture [[id number]] Collected:  08/06/18 1502    Specimen:  Urine, Clean Catch Updated:  08/07/18 1946    Narrative:       Replace urinary catheter prior to obtaining the urine culture  if it has been in place for greater than or equal to 14  days:->N/A No Foley  Indications for Urine Culture:->Suprapubic Pain/Tenderness or  Dysuria  ORDER#: [id number]                                    ORDERED BY: Jeanene Erb  SOURCE: Urine, Clean Catch                           COLLECTED:  08/06/18 15:02  ANTIBIOTICS AT COLL.:                                RECEIVED :  08/06/18 19:02  Culture Urine                              PRELIM      08/07/18 19:46   +  08/07/18   >100,000 CFU/ML Escherichia  coli               Further workup to follow including susceptibility testing              Imaging:   Nm Hepatobiliary    Result Date: 08/07/2018   No evidence of acute cholecystitis. Laurena Slimmer, MD 08/07/2018 10:37 AM    Ct Abd/pelvis With Iv Contrast Only    Result Date: 08/06/2018   1. Multiple prominent mesenteric lymph nodes and hazy infiltration of the fat in the root of the mesentery. 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  Other differential consideration including mesenteric lymphoma, inflammatory bowel disease, idiopathic, and mesenteric venous thrombosis are considered to be less likely. Aldean Ast, MD 08/06/2018 4:02 PM    US Abdomen Limited Ruq    Result Date: 08/06/2018  1. Small amount of sludge in the gallbladder 2. Diffuse gallbladder wall thickening with ringdown artifact in a pattern often associated with adenomyosis. If the patient has acute right upper quadrant pain it would be difficult to exclude superimposed acute or chronic cholecystitis as we have no prior sonograms available for comparison. If there is clinical concern for acute cholecystitis hepatobiliary scan with morphine may be a useful adjunctive imaging study 3. No biliary ductal obstruction 4. Fatty liver Laurena Slimmer, MD 08/06/2018 4:14 PM       Patient Instructions   Discharge Diet: low fat diet    Discharge Activity: as tolerated; avoid exertional and weight lifting activities.    Follow Up Appointment:  Follow-up Information     Joice Lofts, MD. Schedule an appointment as soon as possible for a visit in 1 week(s).    Specialty:  Internal Medicine  Why:  primary care follow up after discharge   Contact information:  [address]  [address]  [place] [address]  [phone]             Smith Mince, MD. Schedule an appointment as soon as possible for a visit in 1 week(s).    Specialty:  Surgery  Why:  post-op follow up   Contact information:  [address]  [address]  [place] [address]  [phone]                     Time spent examining patient, discussing with patient/family regarding hospital course, chart review, reconciling medications and discharge planning: 45 minutes.    Signed,  Teodora Medici MD, MPH  10:49 AM 08/08/2018   Please contact via PerfectServe  SpectraLink: [phone]         This chart was generated using hospital voice-recognition software which does not employ spell-checking or grammar-checking features. It was dictated, all or in part, in a busy and often noisy patient care environment. I have taken all usual measures to dictate carefully and to review all aspects this chart. Nonetheless, given the known and well-documented performance characteristics of VR software in such patient care environments, this dictation still may contain unrecognized and wholly unintended errors or omissions

## 2018-08-08 NOTE — Discharge Instr - Activity (Signed)
As Tolerated

## 2018-08-08 NOTE — Plan of Care (Signed)
Pain still not controlled. Will hold off discharge at this time.   Start scheduled tylenol 650 q6h.  Will reassess this afternoon.     Teodora Medici MD, MPH  [facility]  [facility]   Spectra Link: [phone]  Pager: [phone]

## 2018-08-08 NOTE — Progress Notes (Signed)
08/08/18 0925   Discharge Disposition   Patient preference/choice provided? Yes   Physical Discharge Disposition Home   Receiving facility, unit and room number: n/a   Nursing report phone number: n/a   Facility fax number: n/a   Mode of Engineer, water  (Patient states she will transport herself)   Patient/Family/POA notified of transfer plan Yes   Patient agreeable to discharge plan/expected d/c date? Yes   Family/POA agreeable to discharge plan/expected d/c date? Yes   Bedside nurse notified of transport plan? Yes  Engineer, manufacturing systems)   CM Interventions   Follow up appointment scheduled? No   Reason no follow up scheduled?   (Patient to schedule)   Multidisciplinary rounds/family meeting before d/c? Yes   Medicare Checklist   Is this a Medicare patient? No   Patient received 1st IMM Letter? n/a     Darlis Loan, BSN, RN  RN Case Manager   [phone]

## 2018-08-08 NOTE — Progress Notes (Signed)
Doing well   S/p lap cholecystectomy for acalculous cholecystectomy likely due to sludge and microlithiasis  Diet as tolerated  Pain medications managed  Surgically stable for discharge

## 2018-08-08 NOTE — Discharge Instr - Diet (Signed)
Diabetic Diet

## 2018-08-08 NOTE — Progress Notes (Signed)
Pt discharged home with personal belongs and medications brought from home.Discharged instructions given and verbalizes understanding.Escorted downstairs in a wheel chair by clin-tech.

## 2018-08-08 NOTE — Discharge Instructions (Signed)
Low-Fat Diet    A low-fat diet will help you lose weight. It also can lower cholesterol and prevent symptoms of gallbladder disease. The average American diet contains up to 50% fat. This means that half of all calories come from fat (about 80 grams to 100 grams of fat per day). Choosing normal portions of foods from the list below can help lower your fat intake. Experts recommend that only 20% to 35% of your daily calories come from fat. The remaining 65% to 80% of calories will come from protein and carbohydrates.  Breads  OK: Whole-wheat or rye bread, graham or soda crackers, melba toast, plain rolls, bagels, English muffins  Don't have: Rolls and breads containing whole milk or egg; waffles, pancakes, biscuits, corn bread; cheese crackers, other flavored crackers, pastries, doughnuts  Cereals  OK: Oatmeal, whole-wheat, bran, multigrain, rice  Don't have:Granola or other cereals that have oil, coconut, or more than 2 grams of fat per serving  Cheese and eggs  OK: Cheeses labeled low-fat; 3 whole eggs per week; egg whites and egg substitutes as desired  Don't have: All other cheeses  Desserts  OK: Gelatin, slushy, angel food cake, meringues, nonfat yogurt, and puddings or sherbet made with nonfat milk  Don't have: Any other store-bought desserts, or desserts that have fat, whole milk, cream, chocolate, and coconut  Drinks  OK: Nonfat milk, coffee, tea, fizzy (carbonated) drinks  Don't have:Whole and reduced-fat milk, evaporated and condensed milk, hot chocolate mixes, milk shakes, malts, eggnog  Fats  OK: You may have up to 3 teaspoons of fat daily. This can be butter, margarine, mayonnaise, or healthy oils (canola or olive)  Don't have: Cream, nondairy creams, cream cheese, gravies, and cream sauces  Fruits  OK: All fruits made without fat  Don't have: Coconut, olives  Meats, poultry, fish  OK: Limit meat to 6 ounces daily (broiled, roasted, baked, grilled, or boiled). Buy lean cuts, and trim off the fat. Try  beef, fish, lamb, pork, and canned fish packed in water; also chicken and turkey with the skin removed.  Don't have: Fried meats, fish, or poultry; fried eggs, and fish canned in oils; fatty meats such as bacon, sausage, corned beef, hot dogs, and lunch meats; meats with gravies and sauces  Potatoes, beans, pasta  OK: Dried beans, split peas, lentils, potatoes, rice, pasta made without added fat  Don't have: French fries, potato chips, potatoes prepared with butter, refried beans  Soups  OK: Clear broth soups without fat and with allowed vegetables  Don't have: Cream-based soups  Vegetables  OK: Fresh, frozen, canned or dried vegetables, all made without added fat  Don't have: Fried vegetables and those prepared with butter, cream, sauces  Other foods  OK: Salt, sugar, jelly, hard candy, marshmallows, honey, syrup, spices and herbs, mustard, ketchup, lemon, and vinegar. Try to limit sweets and added sugars.  Don't have: Chocolate, nuts, coconut, and cream candies; sunflower, sesame, and other seeds; fried foods; cream sauces and gravies; pizza  [employer] last reviewed this educational content on 01/06/2018   2000-2020 [employer] [address], [place] [address]. All rights reserved. This information is not intended as a substitute for professional medical care. Always follow your healthcare professional's instructions.

## 2018-08-09 ENCOUNTER — Encounter: Payer: Self-pay | Admitting: Surgery

## 2020-02-08 NOTE — Progress Notes (Deleted)
Subjective:       Patient ID: Ruth [name] is a 69 y.o. female.    HPI    Ruth [name] is being referred by Dr.     for consultation regarding diabetes.    Ruth [name] is a 69 y.o. -year-old {Desc; female/female:11659} with a history of diabetes since {YEAR:22881}.    In terms of severity with regards to complications, the patient {ACTIONS; HAS/DOES NOT HAVE:19233} retinopathy, {ACTIONS; HAS/DOES NOT HAVE:19233} nephropathy, {ACTIONS; HAS/DOES NOT HAVE:19233} neuropathy, and {ACTIONS; HAS/DOES NOT HAVE:19233} cardiovascular disease.     With regards to the quality of blood sugar control, the most recent hemoglobin A1c test was     in {MONTHS OF THE ZOXW:96045}.  The patient checks blood sugars {NUMBERS 0-12:18577} times per day.   Current anti-hyperglycemic regimen is comprised of Humalog tid, glimepiride 4 mg bid, Lantus 28 units daily, metformin 1000 mg bid, Trulicity 0.75 mg weekly.  Other medications tried in the past include:      In terms of associated symptoms at this time, the patient {ACTIONS; HAS/DOES NOT HAVE:19233} polyuria, {ACTIONS; HAS/DOES NOT HAVE:19233} blurred vision, {ACTIONS; HAS/DOES NOT HAVE:19233} paresthesias, FOR NEW PATIENTS ONLY: {HAS/DOES NOT HAVE:24588} chest pain, {HAS/DOES NOT HAVE:24588} shortness of breath, and {HAS/DOES NOT HAVE:24588} depression.  With regards to gastroparesis symptoms, the patient {ACTIONS; HAS/DOES NOT HAVE:19233} nausea and {ACTIONS; HAS/DOES NOT HAVE:19233} diarrhea    Per the patient's history, last fundoscopic exam was in {MONTHS OF THE WUJW:11914} and last podiatry exam was {MONTHS OF THE NWGN:56213}.  Patient had a {NUMBERS 1-31:22730} lb weight change in the past {NUMBERS:29512}   months.    .   Currently the patient's diet comprises of {NUMBERS 0-10:22821} meals per day.  Prior formal diabetes education/ nutritional counseling {HAS HAS YQM:57846} been performed.       Current frequency of hypoglycemia is   .  Symptoms include   .  The patient  {DOES/DOES NGE:95284} have a medic alert bracelet/ID that indicates diabetes status.  The patient {DOES/DOES XLK:44010} have an updated glucagon emergency kit.  The patient currently lives with       {Common ambulatory SmartLinks:19316}    Review of Systems        Objective:    Physical Exam        Assessment:       69 y.o. 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    I reviewed and summarized the patient's previous medical records today, including previous relevant clinical notes, laboratory results, and imaging reports.  I obtained old records for review at this visit.          Plan:       1. Diabetes type 1 or 2 (new, worsening, stable): Will check hemoglobin A1c today.  Based on recent blood sugar pattern of elevated fasting sugars and elevated prandial/post-prandial sugars, will adjust medication/insulin as follows:   with sliding scale correction factor of  .  Check sugars before each meal and bedtime/AM fasting and 2 hours after biggest meal of day with goal fasting/pre-meal range of 80-130; goal postprandial range of 80-160.  Is due for eye exam in   ; will screen for nephropathy and check spot urine microalbumin/Cr or urine total protein/Cr.  Continue to see podiatry for foot care.  Continue carbohydrate-controlled diet/refer to diabetes educator for counseling.  Will check CMP, CBC, thyroid function tests, B12, folate, GAD antibody, IA-2 antibody, insulin antibody,  insulin level, c-peptide level, 75-g OGTT, fasting glucose, gastric emptying study, coronary artery calcification.  Referrals to diabetes center, opthalmology, podiatry, nephrology, and cardiology provided at today's visit.    2. Hypertension (stable): goal blood pressure is <130/80.  Borderline controlled on regimen of    3. Hyperlipidemia (stable): check fasting lipid panel on  ; goal LDL<100.  Continue low  cholesterol, low saturated fat diet.    4. Overweight status/Obesity (stable): counseled patient on at least 150 min/wk of moderate-intensity exercise and consider flexibility/strength training exercises if possible.  Physical activity programs should begin slowly and build up gradually; nutritional counseling status.    5. Hypoglycemia management (stable): counseled patient on need to keep sugar source with him/her at all times, up-to-date glucagon kit, and medic-alert bracelet/indicator specifying diagnosis of diabetes    A total of {% 0-100:24843} minutes were spent with the patient which include time spent during face-to-face interaction with the patient and non face-to-face time including time spent reviewing and analyzing records, ordering tests, ordering medications, and completing documentation.  This included time spent counseling the patient on proper administration of medication/insulin, other drug therapy options, dietary recommendations, goals of diabetes therapy, and summarizing potential complications of diabetes.        Procedures

## 2020-02-17 ENCOUNTER — Ambulatory Visit (INDEPENDENT_AMBULATORY_CARE_PROVIDER_SITE_OTHER): Payer: BLUE CROSS/BLUE SHIELD | Admitting: Endocrinology, Diabetes and Metabolism

## 2020-03-24 ENCOUNTER — Ambulatory Visit (INDEPENDENT_AMBULATORY_CARE_PROVIDER_SITE_OTHER): Payer: BLUE CROSS/BLUE SHIELD | Admitting: Student in an Organized Health Care Education/Training Program

## 2020-03-24 NOTE — Progress Notes (Deleted)
[facility] - NEW OUTPATIENT ENCOUNTER   [address], [address]  [place], [place] [address]  Phone: [phone]        Patient Name: Ruth [name]  MRN: [id number]  Primary Care MD: Joice Lofts, MD Phone: [phone]    Date: 03/24/2020 Time: 12:42 PM    CC: ***    HPI: Ruth [name] is a 69 y.o. female with past medical history significant for type 2 diabetes and GERD who presents to clinic today for further evaluation regarding memory difficulties.        Past Medical History:   Diagnosis Date   . Abnormal vision    . Diabetes mellitus     type2   . Gastroesophageal reflux disease    . Type 2 diabetes mellitus, controlled        Past Surgical History:   Procedure Laterality Date   . KNEE SURGERY  2003   . LAPAROSCOPIC, CHOLECYSTECTOMY N/A 08/07/2018    Procedure: LAPAROSCOPIC, CHOLECYSTECTOMY;  Surgeon: Smith Mince, MD;  Location: [facility];  Service: General;  Laterality: N/A;       Medications:  Current Outpatient Medications on File Prior to Visit   Medication Sig Dispense Refill   . acetaminophen (TYLENOL) 325 MG tablet Take 2 tablets (650 mg total) by mouth every 4 (four) hours as needed for Pain Maximum dose of acetaminophen is 4000 mg from all sources in 24 hours     . glimepiride (AMARYL) 4 MG tablet Take 4 mg by mouth 2 (two) times daily.     Marland Kitchen HUMALOG KWIKPEN 100 UNIT/ML Solution Pen-injector injection pen Inject into the skin 3 (three) times daily before meals Sliding scale     . ibuprofen (ADVIL,MOTRIN) 600 MG tablet Take 1 tablet (600 mg total) by mouth every 6 (six) hours as needed for Fever (pain 5-8) 30 tablet 0   . insulin glargine 100 UNIT/ML injection pen Inject 28 Units into the skin daily        . ketorolac (TORADOL) 10 MG tablet Take 1 tablet (10 mg total) by mouth every 6 (six) hours as needed for Pain 20 tablet 0   . metFORMIN (GLUCOPHAGE) 1000 MG tablet Take 1,000 mg by mouth 2 (two) times daily with meals.     . ondansetron (ZOFRAN-ODT) 4 MG disintegrating tablet  Take 1 tablet (4 mg total) by mouth every 6 (six) hours as needed for Nausea 20 tablet 0   . TRULICITY 0.75 MG/0.5ML Solution Pen-injector Inject 0.75 mg into the skin once a week     . venlafaxine (EFFEXOR) 75 MG tablet Take 225 mg by mouth daily          No current facility-administered medications on file prior to visit.         Allergies:   Allergies   Allergen Reactions   . Codeine Anaphylaxis     "throat closing" per patient   . Ciprofloxacin Rash         Tobacco Dependence:  Social History     Tobacco Use   Smoking Status Never Smoker   Smokeless Tobacco Never Used     Counseling given: Not Answered      ROS       PHYSICAL EXAM:  There were no vitals taken for this visit.    Heart: No peripheral edema  Lung: Clear to auscultation  Neck: No cervical LAD, normal ROM, trachea midline  Extremities: No erythema, rashes    Neurologic Exam  LAB & TEST RESULTS:  No results found for this or any previous visit.      IMAGES: MRI brain report from 2017 reviewed which showed mild chronic small vessel ischemic changes as well as extra-axial enhancement of the right frontal region which at that time was suspicious for small meningioma    OTHER TESTS:***    ASSESSMENT AND PLAN:  Ruth [name] is a 69 y.o. female ***            Problem List Items Addressed This Visit     None          FUTURE STUDIES/TESTS/TREATMENTS TO CONSIDER:  ***    There are no Patient Instructions on file for this visit.      I have spent *** minutes reviewing the record, interviewing and examining the patient, and documenting the encounter.     Illa Level, MD  March 24, 2020     [facility] [place]  [address]  [place], [place] [address]  Phone: [phone]

## 2021-05-02 IMAGING — DX CHEST PA AND LATERAL
1 series · 2 of 2 positions shown · non-contrast
Comparison: none

________________________________________________________________________________________________ 
CLINICAL INDICATION:  Chest pain after a fall one month ago. 
COMPARISON EXAMINATIONS:  None.

[Series 1: PA · U · 0.14mm/px · 2 of 2 slices shown]
[im 1/2]
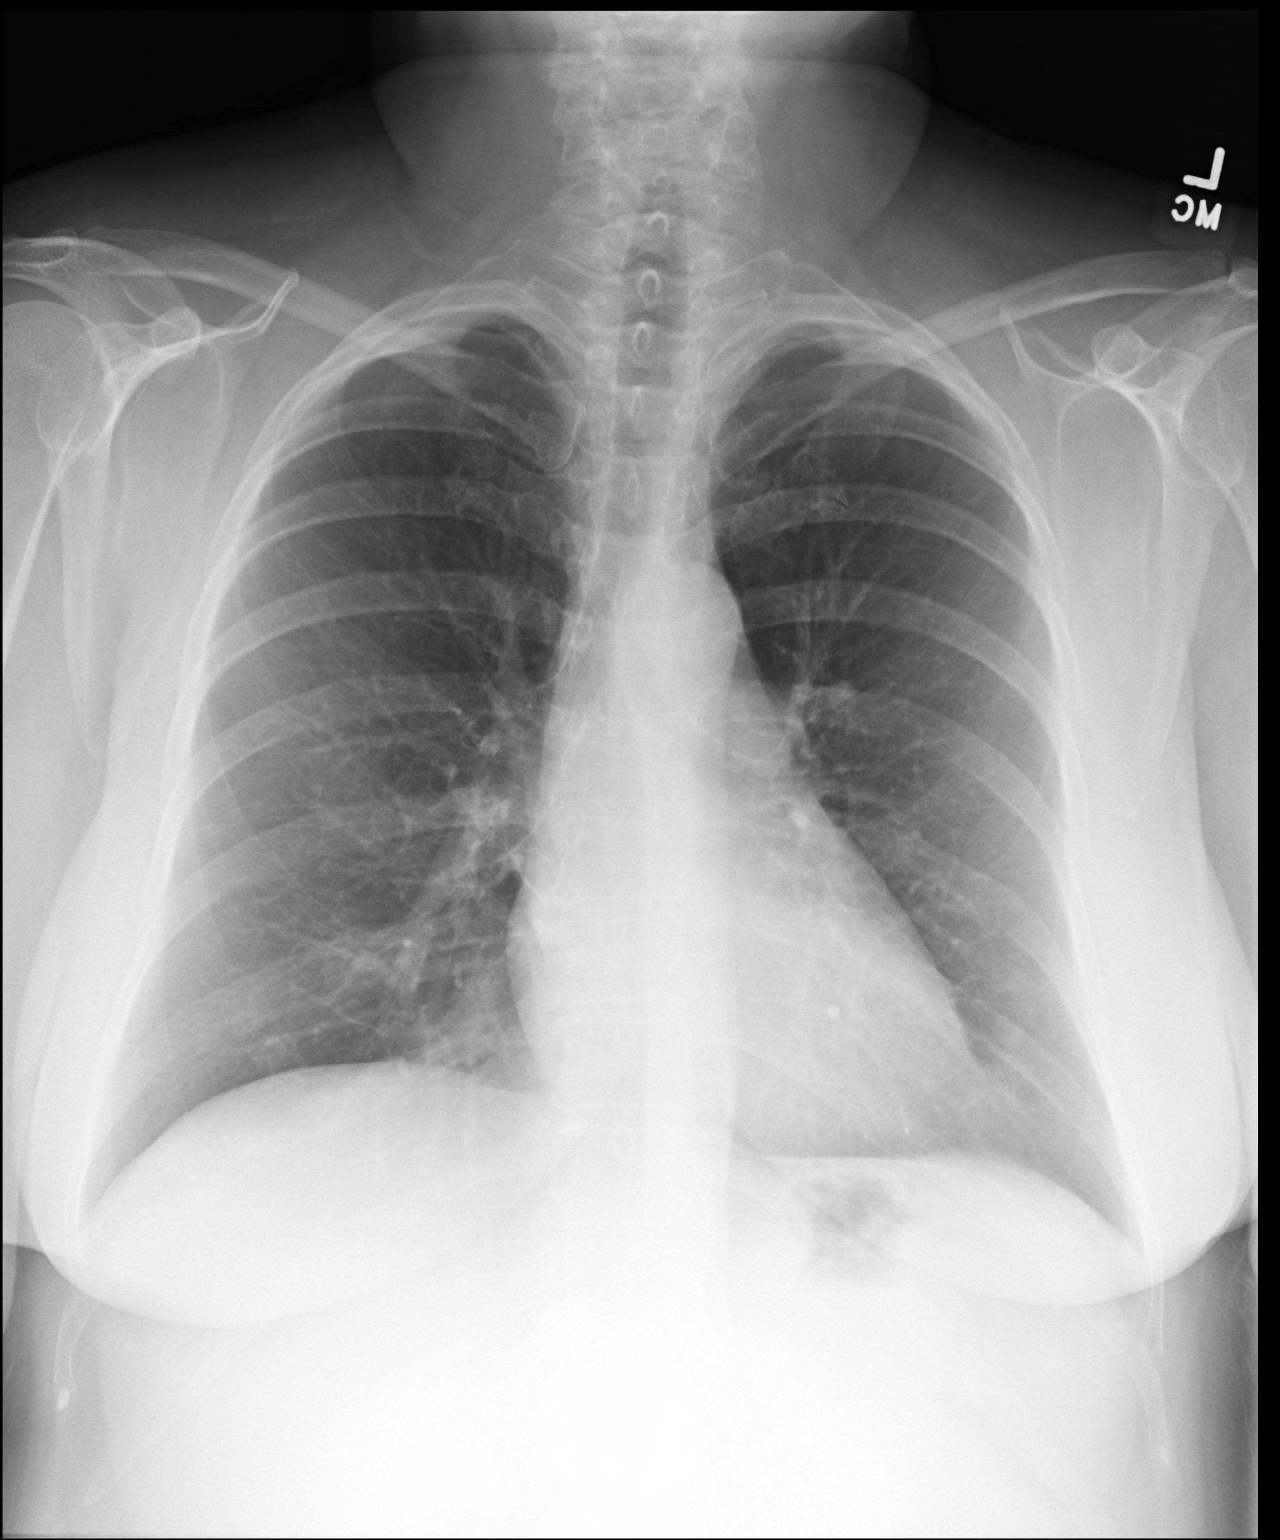
[im 2/2]
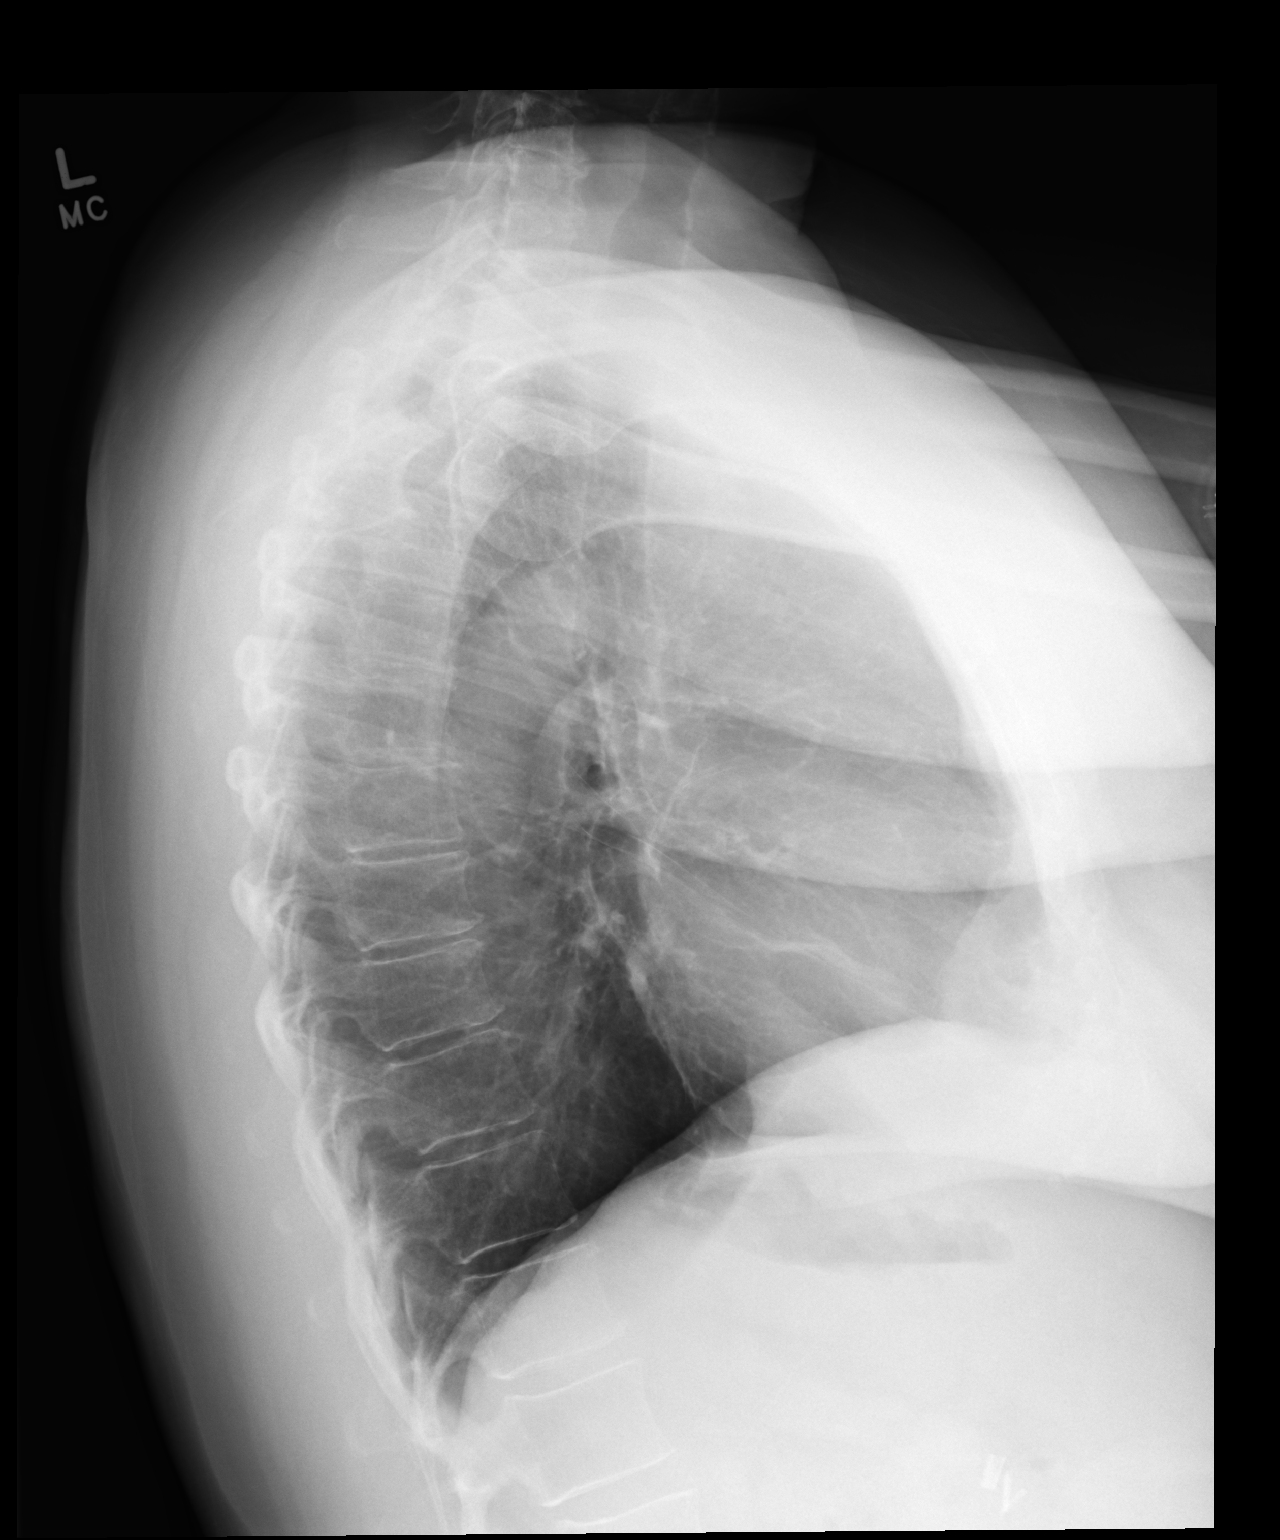

[2 of 2 positions shown; findings below may reference images not displayed]

FINDINGS: The lungs are clear and there is no evidence for pleural effusion. 
There is no evidence for pneumothorax. No acute skeletal abnormality is found. 
There are mild degenerative changes in the spine. The heart size is normal. 
There is no mediastinal widening. The hila appear normal.
IMPRESSION: No radiographic evidence for clinically significant cardiopulmonary pathology.

## 2021-05-15 IMAGING — MR MRI ABDOMEN W/WO CONTRAST WITH MRCP
21 of 25 series · 38 of 48 positions shown · IV contrast (gadolinium)
Comparison: None

________________________________________________________________________________________________ 
MRI ABDOMEN W/WO CONTRAST WITH MRCP, 05/15/2021 [DATE]: 
CLINICAL INDICATION: Diarrhea and abdominal pain for 7 months. Previous 
cholecystectomy.
TECHNIQUE: Multiplanar, multi acquisition MR images of the abdomen were 
performed without and with intravenous gadolinium enhancement including dynamic 
imaging.  MRCP sequences were performed with post processing.  7.5 mL of 
Gadavist were injected intravenously by hand. 0 mL of Gadavist was discarded.  
Patient was scanned on a 1.5T magnet.  The patients eGFR was calculated to be 
83.0 mL/min/1.73 m2 using the i-STAT device.

[Series 201: survey bh navi · axial · 15.0mm · 1.76mm/px · z∈[+8,+200]mm · 2 of 15 slices shown]
[im 1/15]
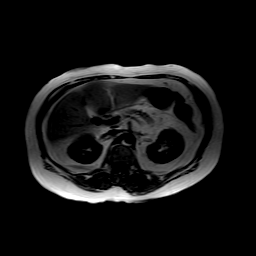
[im 15/15]
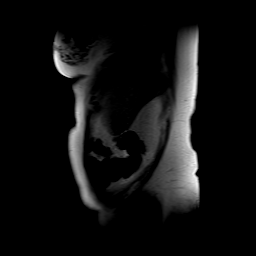

[Series 301: T2 · coronal · 5.0mm · 0.81mm/px · 1 of 32 slices shown]
[im 1/32]
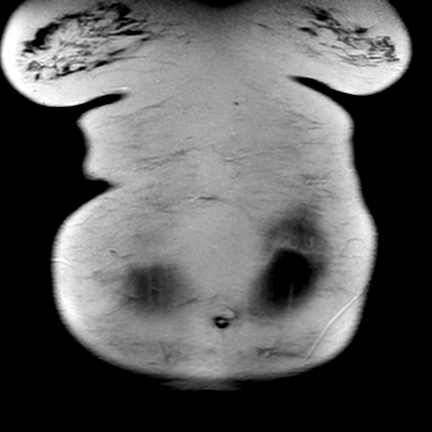

[Series 402: sout of phase · axial · 6.0mm · 1.09mm/px · 1 of 36 slices shown]
[im 1/36]
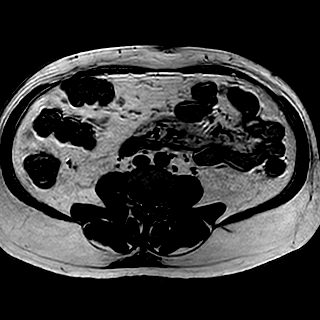

[Series 403: sin phase · axial · 6.0mm · 1.09mm/px · 1 of 36 slices shown]
[im 1/36]
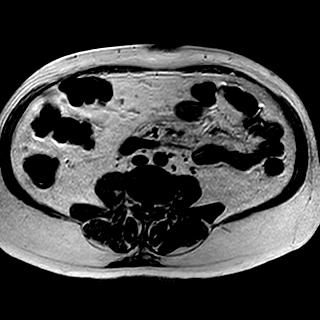

[Series 501: t2_ax_mvxd_hr_rt · axial · 5.0mm · 0.88mm/px · 1 of 40 slices shown]
[im 1/40]
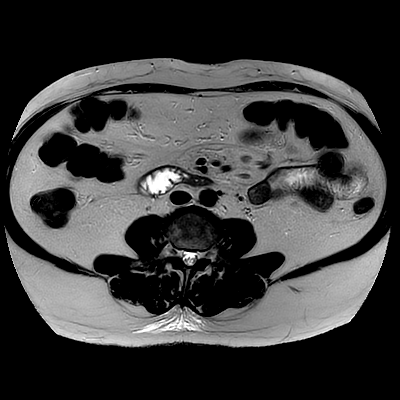

[Series 601: t2_spair_ax_mvxd_hr (panc)_rt · axial · 3.0mm · 0.88mm/px · 1 of 40 slices shown]
[im 1/40]
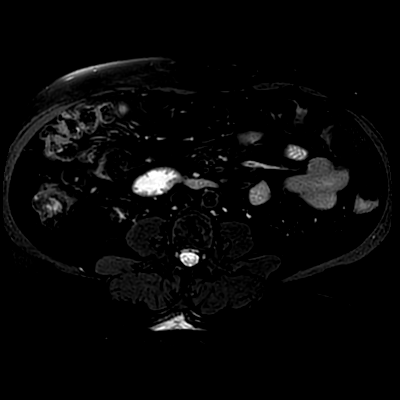

[Series 701: 3d_grase_bh · coronal · 2.4mm · 0.78mm/px · 1 of 70 slices shown]
[im 1/70]
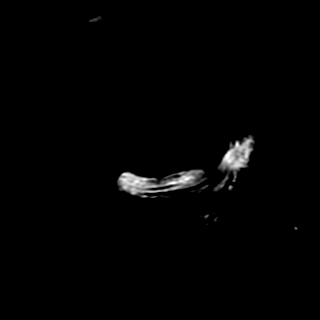

[Series 801: ssh_mrcp rad · coronal · 40.0mm · 0.59mm/px · 1 of 6 slices shown]
[im 1/6]
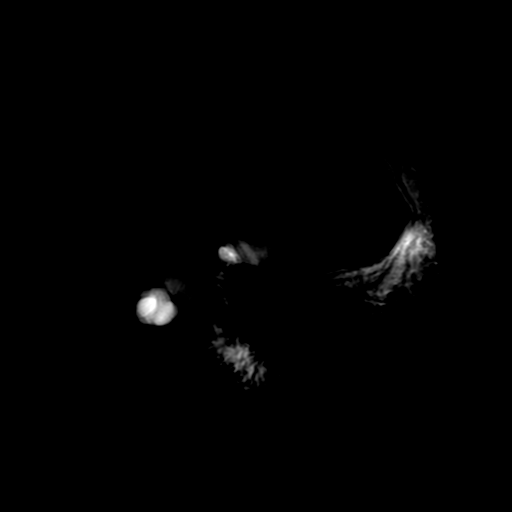

[Series 901: dwi_3b_rt · axial · 5.0mm · 1.74mm/px · z∈[-79,+158]mm · 2 of 88 slices shown]
[im 1/88]
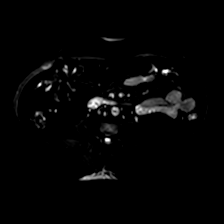
[im 88/88]
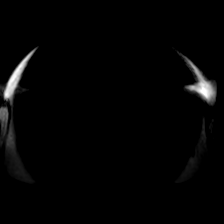

[Series 902: sbo · axial · 5.0mm · 1.74mm/px · 1 of 44 slices shown]
[im 1/44]
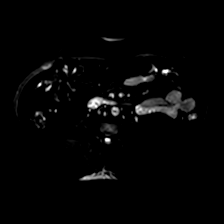

[Series 903: (id) · axial · 5.0mm · 1.74mm/px · 1 of 44 slices shown]
[im 1/44]
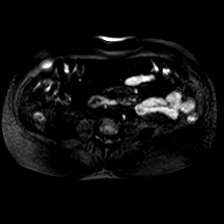

[Series 904: dadc 600 · axial · 5.0mm · 1.74mm/px · 1 of 44 slices shown]
[im 1/44]
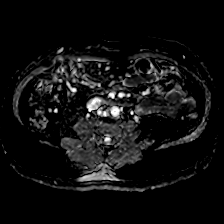

[Series 1001: mdixon_dyn_(person_name)_(person_name) · axial · 4.0mm · 0.91mm/px · z∈[-78,+160]mm · 8 of 600 slices shown]
[im 1/600]
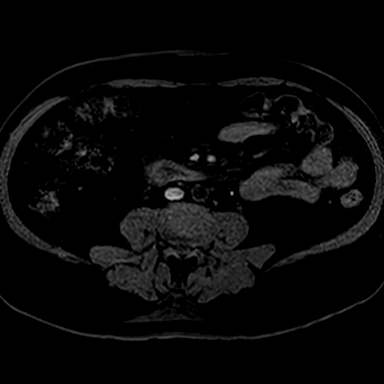
[im 109/600]
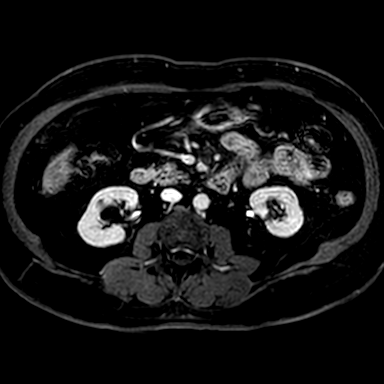
[im 164/600]
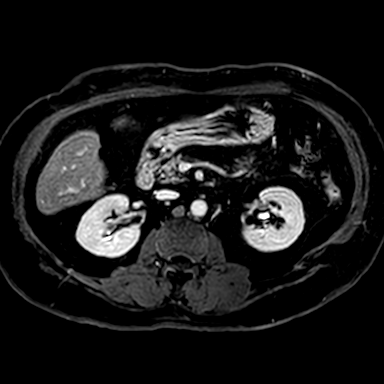
[im 273/600]
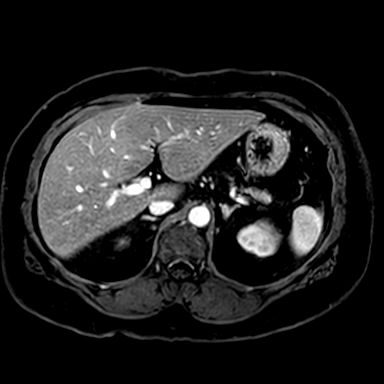
[im 327/600]
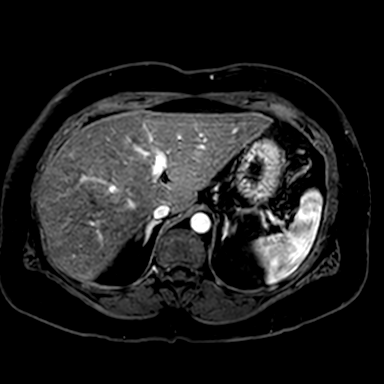
[im 436/600]
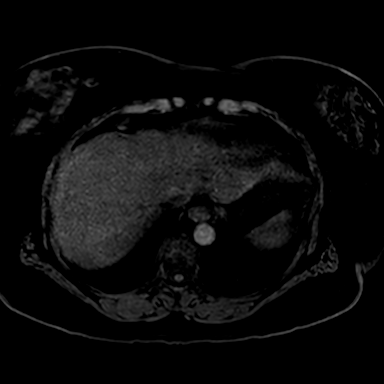
[im 491/600]
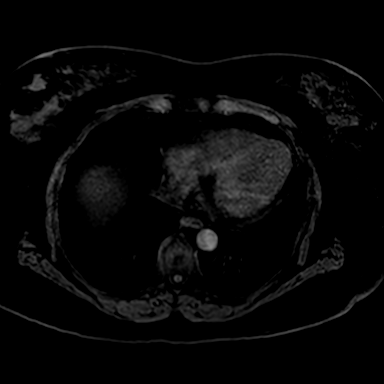
[im 600/600]
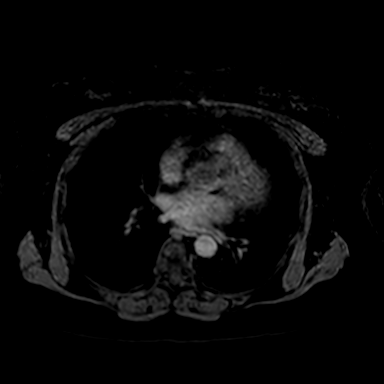

[Series 1002: DIXON · axial · 4.0mm · 0.91mm/px · z∈[-78,+160]mm · 2 of 120 slices shown (1 of 5)]
[im 1/120]
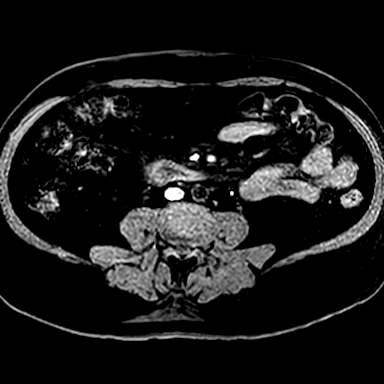
[im 120/120]
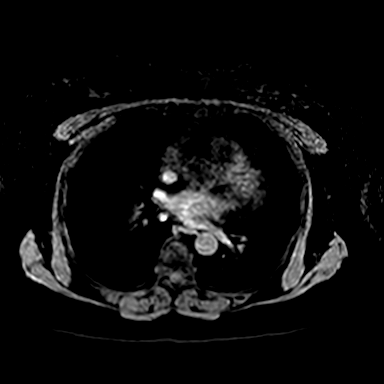

[Series 1003: DIXON · axial · 4.0mm · 0.91mm/px · z∈[-78,+160]mm · 2 of 120 slices shown (2 of 5)]
[im 1/120]
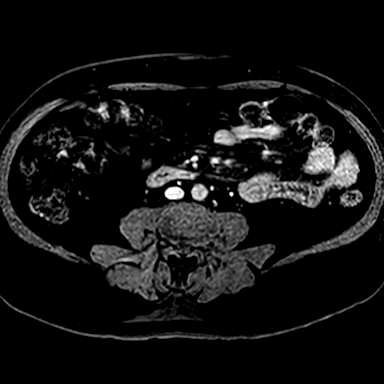
[im 120/120]
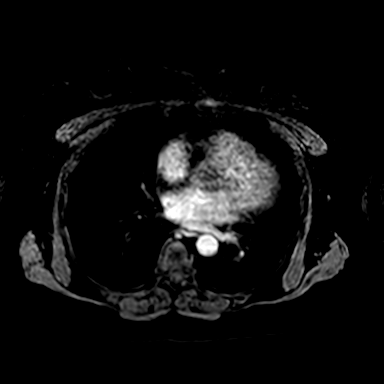

[Series 1004: DIXON · axial · 4.0mm · 0.91mm/px · z∈[-78,+160]mm · 2 of 120 slices shown (3 of 5)]
[im 1/120]
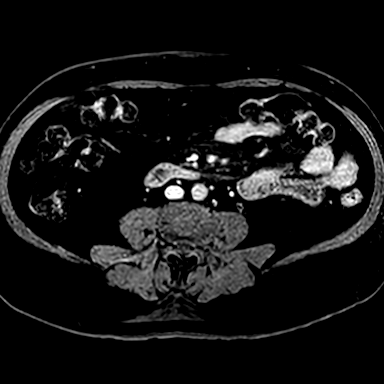
[im 120/120]
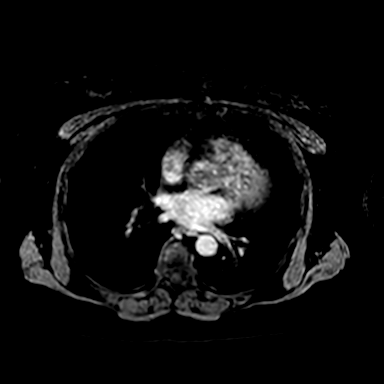

[Series 1005: DIXON · axial · 4.0mm · 0.91mm/px · z∈[-78,+160]mm · 2 of 120 slices shown (4 of 5)]
[im 1/120]
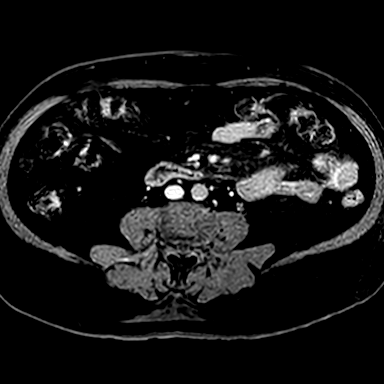
[im 120/120]
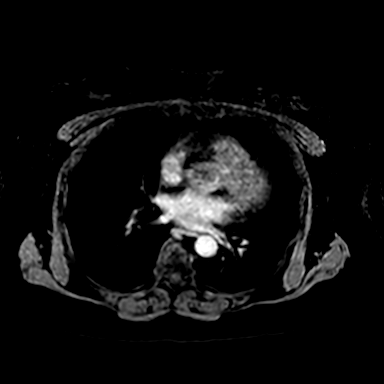

[Series 1006: DIXON · axial · 4.0mm · 0.91mm/px · z∈[-78,+160]mm · 2 of 120 slices shown (5 of 5)]
[im 1/120]
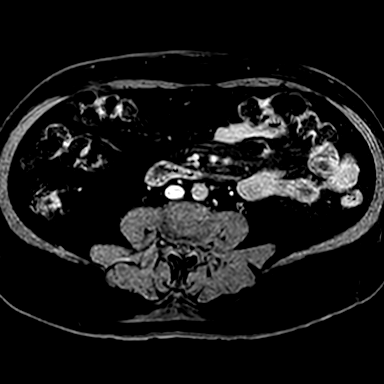
[im 120/120]
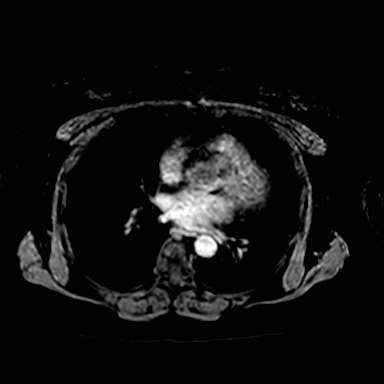

[Series 1007: DIXON post-contrast · axial · 4.0mm · 0.91mm/px · z∈[-78,+160]mm · 2 of 120 slices shown (1 of 3)]
[im 1/120]
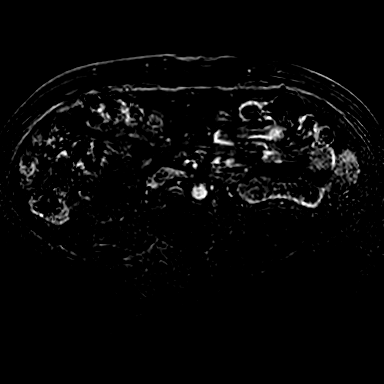
[im 120/120]
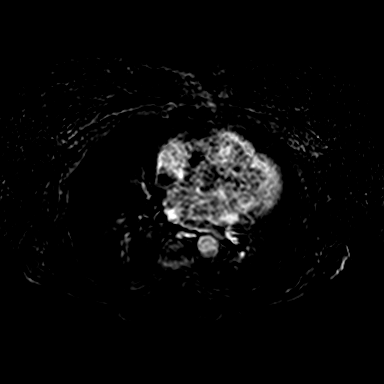

[Series 1008: DIXON post-contrast · axial · 4.0mm · 0.91mm/px · z∈[-78,+160]mm · 2 of 120 slices shown (2 of 3)]
[im 1/120]
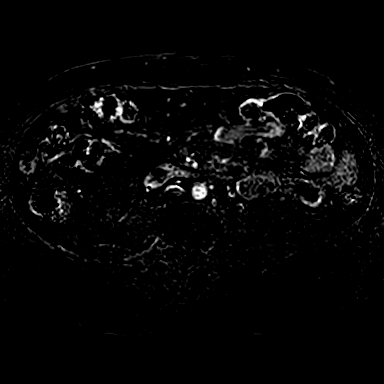
[im 120/120]
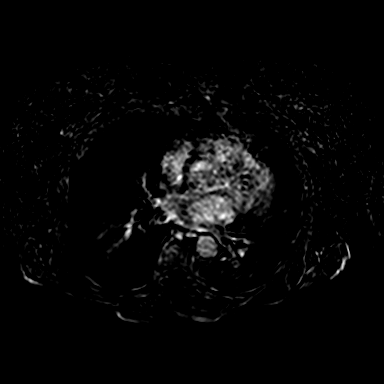

[Series 1009: DIXON post-contrast · axial · 4.0mm · 0.91mm/px · z∈[-78,+160]mm · 2 of 120 slices shown (3 of 3)]
[im 1/120]
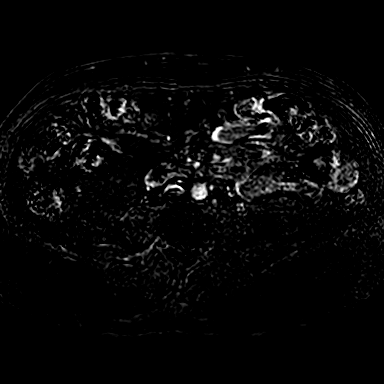
[im 120/120]
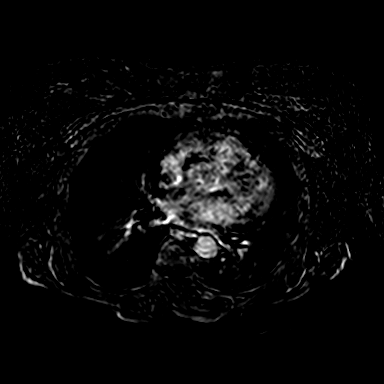

[38 of 48 positions shown; findings below may reference images not displayed]

FINDINGS: The liver, spleen, pancreas, adrenal glands and kidneys are normal in 
appearance. Simple cyst are seen in the kidneys. Mild degenerative changes. 
There has been a previous cholecystectomy. The biliary system is normal in 
appearance which includes evaluation with MRCP images. No intra or extra ductal 
dilatation identified. There is no abnormal enhancement on todays exam. 
Pancreatic duct is within normal limits. No mass seen.
IMPRESSION: Previous cholecystectomy. Renal cysts. No suspicious abnormality identified.

## 2021-05-16 IMAGING — CT CT ORBITS WITHOUT CONTRAST
3 series · 11 of 47 positions shown, 13 images · non-contrast
Comparison: None

________________________________________________________________________________________________ 
CT ORBITS WITHOUT CONTRAST, 05/16/2021 [DATE]: 
CLINICAL INDICATION: 70-year-old female with right eye pain after fall. 
A search for DICOM formatted images was conducted for prior CT imaging studies 
completed at a non-affiliated media free facility.
TECHNIQUE: The orbits were scanned without contrast on a high resolution low 
dose CT scanner. Standard reconstructions were performed.

[Series 3: orbit/face st 2.0 j30s 1 · axial · 0.35mm/px · z∈[+904,+966]mm · 5 of 41 slices shown, 7 images]
[im 5/41  brain]
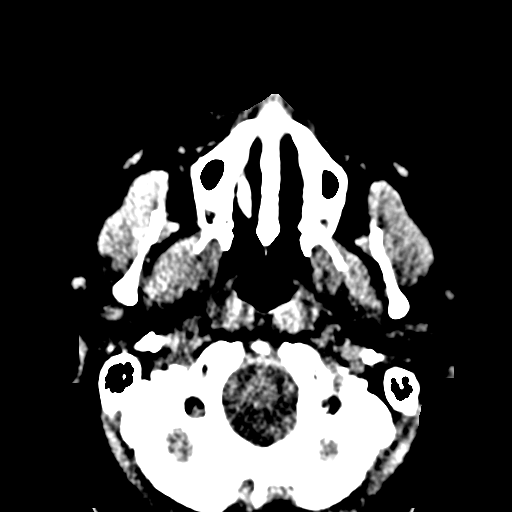
[im 5/41  bone]
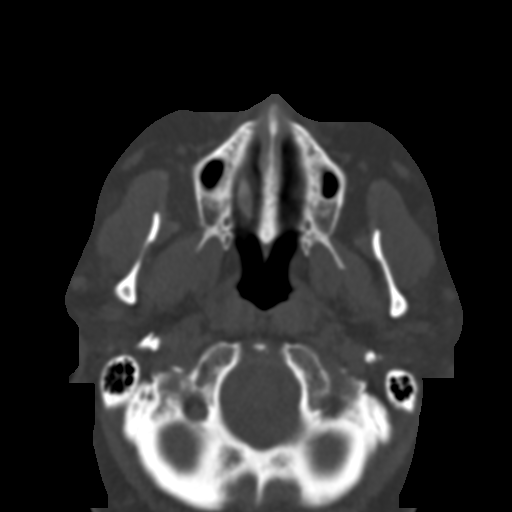
[im 13/41  bone]
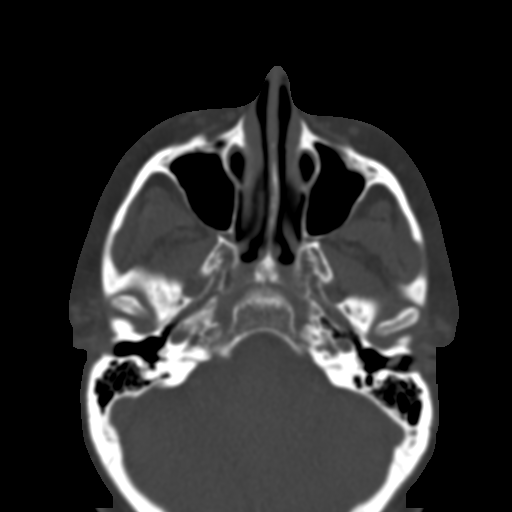
[im 21/41  bone]
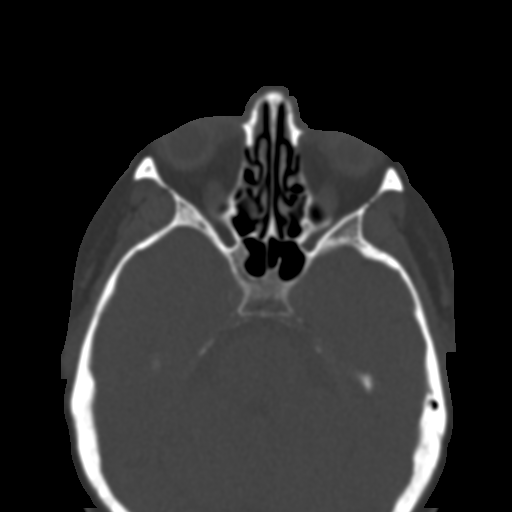
[im 28/41  bone]
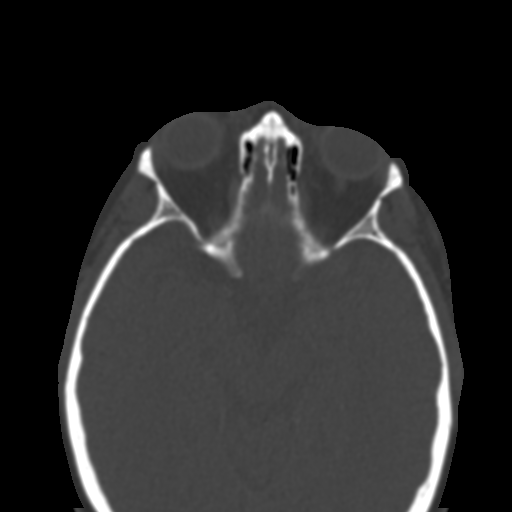
[im 36/41  brain]
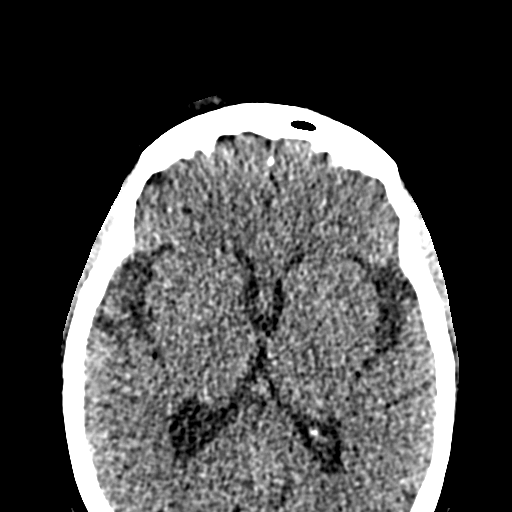
[im 36/41  bone]
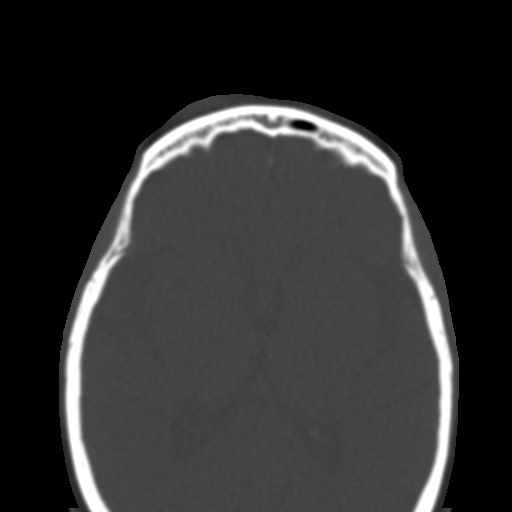

[Series 4: coronal · coronal · 0.17mm/px · 3 of 74 slices shown]
[im 25/74  bone]
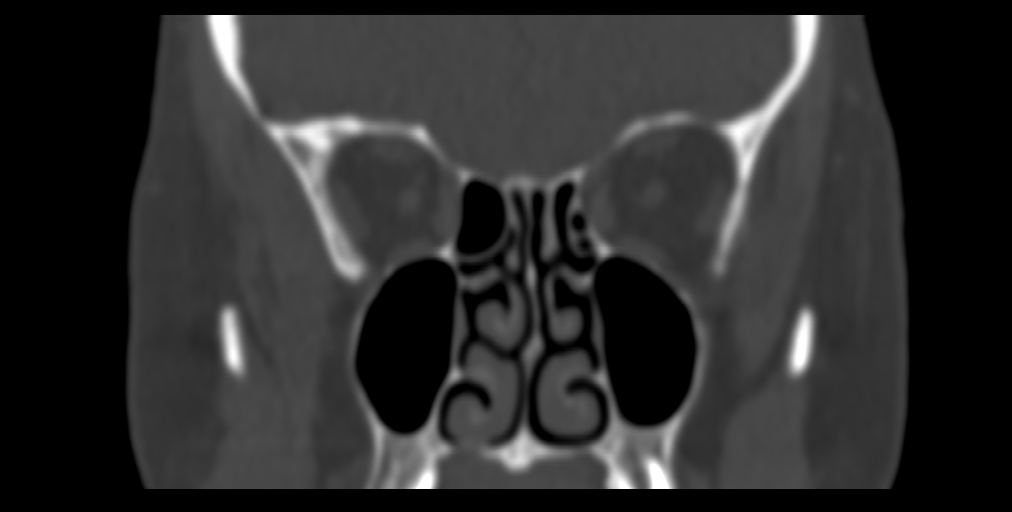
[im 33/74  bone]
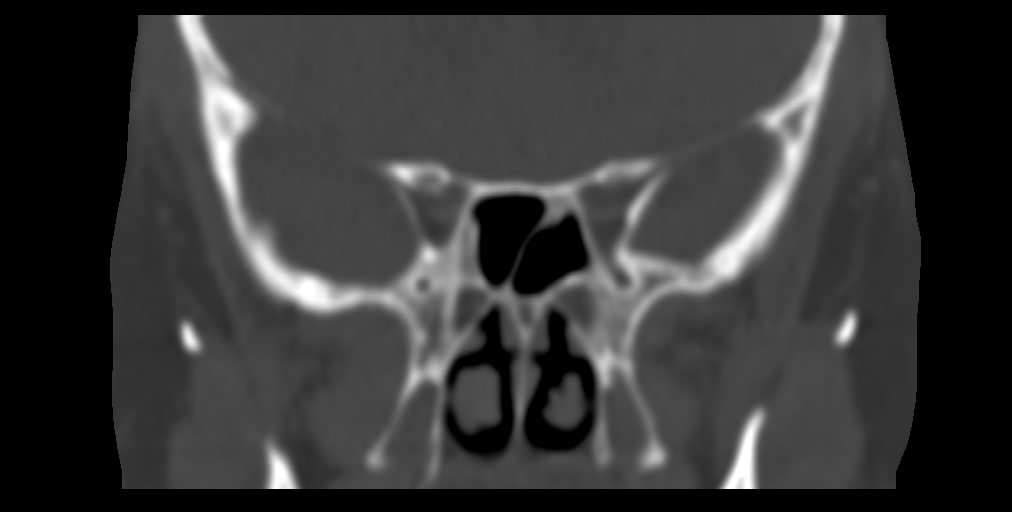
[im 41/74  bone]
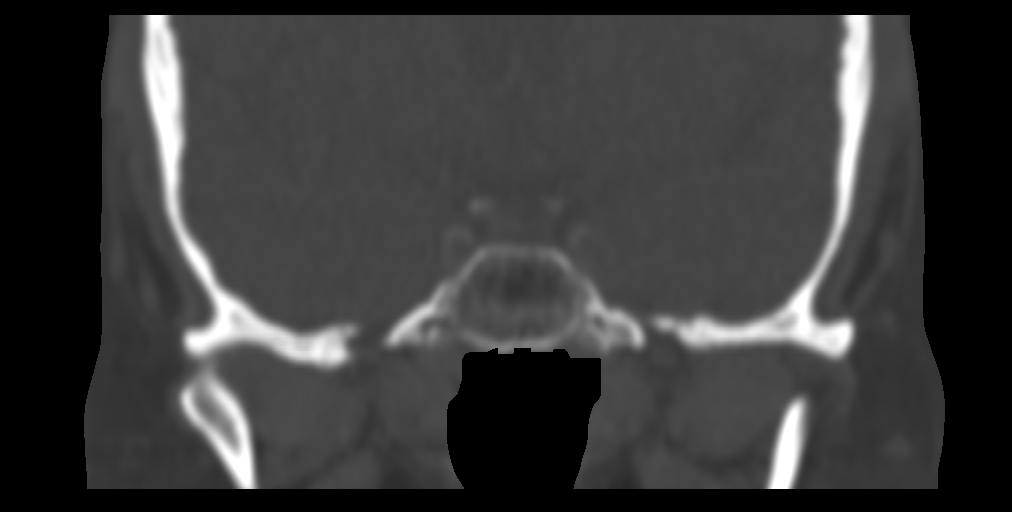

[Series 5: sag · sagittal · 0.17mm/px · 3 of 70 slices shown]
[im 24/70  bone]
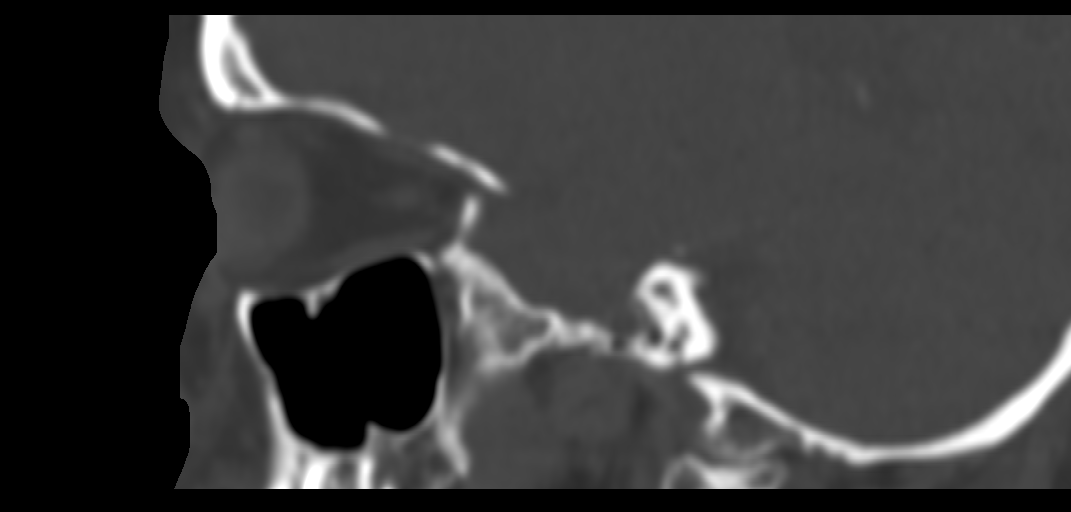
[im 35/70  bone]
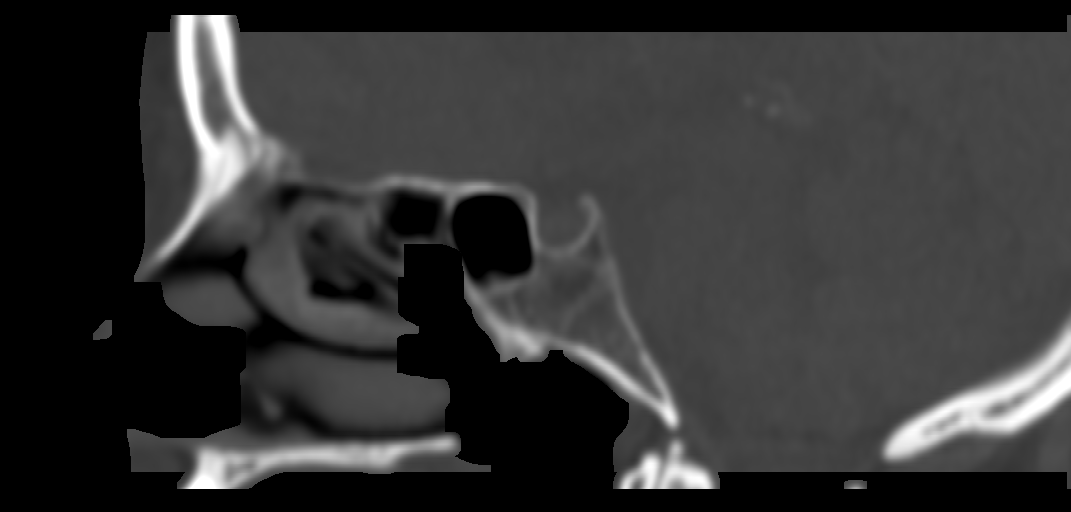
[im 47/70  bone]
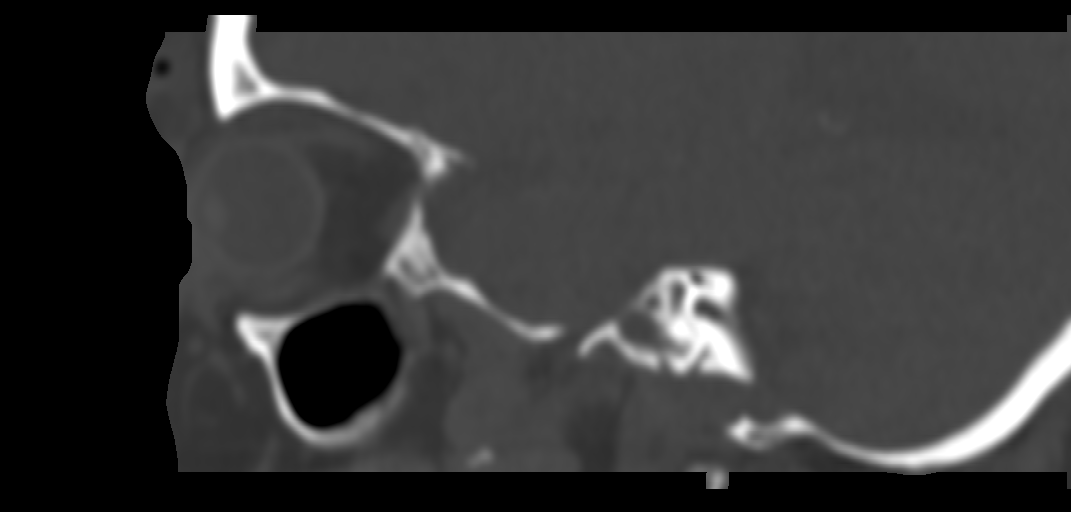

[11 of 47 positions shown; findings below may reference images not displayed]

FINDINGS: BONES: Normal. 
Soft tissues: There is soft tissue swelling in the right supraorbital region 
associated with subcutaneous gas and some nonspecific contusion. Soft tissue 
swelling extends along the right lateral orbital rim. 
Orbits and sinuses: Symmetric globes bilaterally. No acute orbital post septal 
stranding. There appears to be mild preseptal soft tissue swelling involving the 
right orbit. No orbital hematoma. The extraocular muscles are symmetric 
bilaterally. Optic nerves unremarkable. The region of the cavernous sinus is 
unremarkable given limitations of no intravenous contrast. 
ADDITIONAL FINDINGS: No acute intracranial abnormality. The mastoids and middle 
ear cavities are well pneumatized. There are 5 mm nodules in both parotid 
glands, nonspecific, but likely reflective of nonenlarged and parotid lymph 
nodes. If
IMPRESSION: No evidence of acute fracture. Right periorbital soft tissue swelling as 
detailed above without evidence of postseptal abnormality. 
RADIATION DOSE REDUCTION: All CT scans are performed using radiation dose 
reduction techniques, when applicable.  Technical factors are evaluated and 
adjusted to ensure appropriate moderation of exposure.  Automated dose 
management technology is applied to adjust the radiation doses to minimize 
exposure while achieving diagnostic quality images.

## 2021-05-16 IMAGING — DX HAND 3 VIEWS RIGHT
1 series · 3 of 3 positions shown · non-contrast
Comparison: None.

________________________________________________________________________________________________ 
HAND 3 VIEWS RIGHT, 05/16/2021 [DATE]: 
CLINICAL INDICATION: Pain. Fall on outstretched hand on Sunday.

[Series 1: PA · U · 0.14mm/px · 3 of 3 slices shown]
[im 1/3]
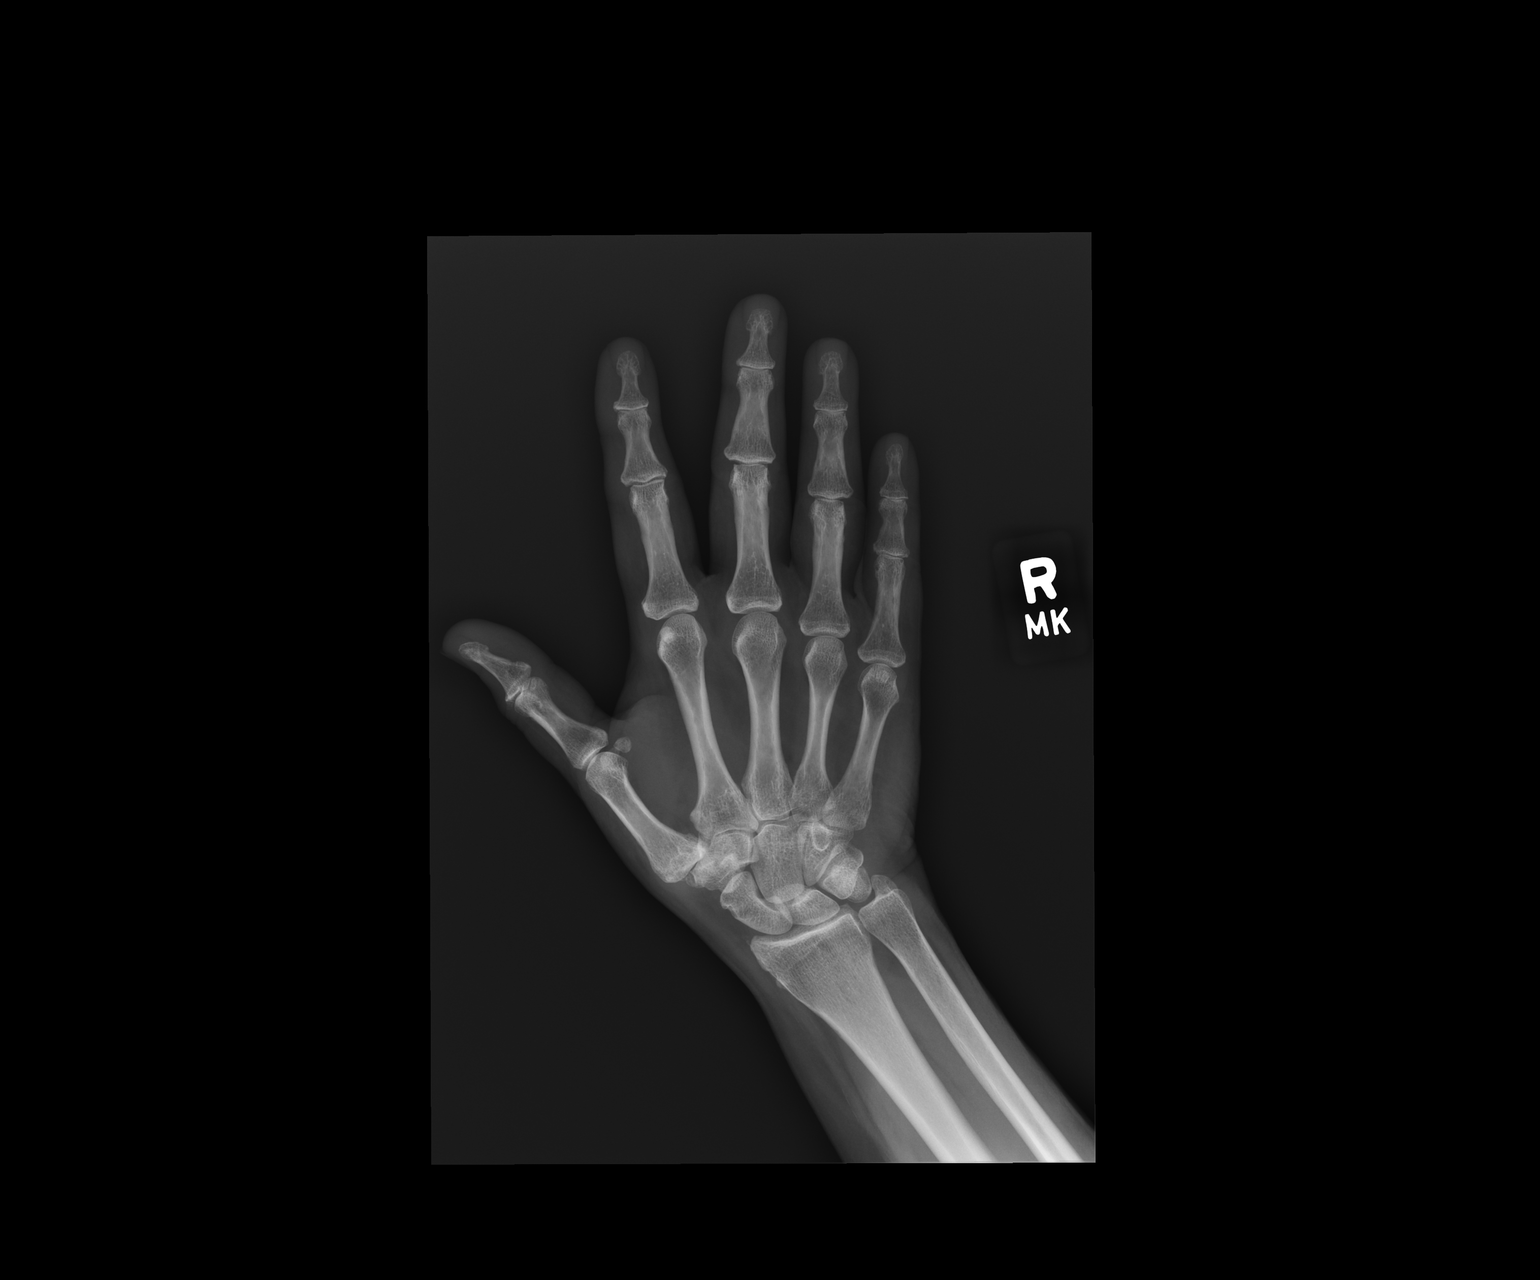
[im 2/3]
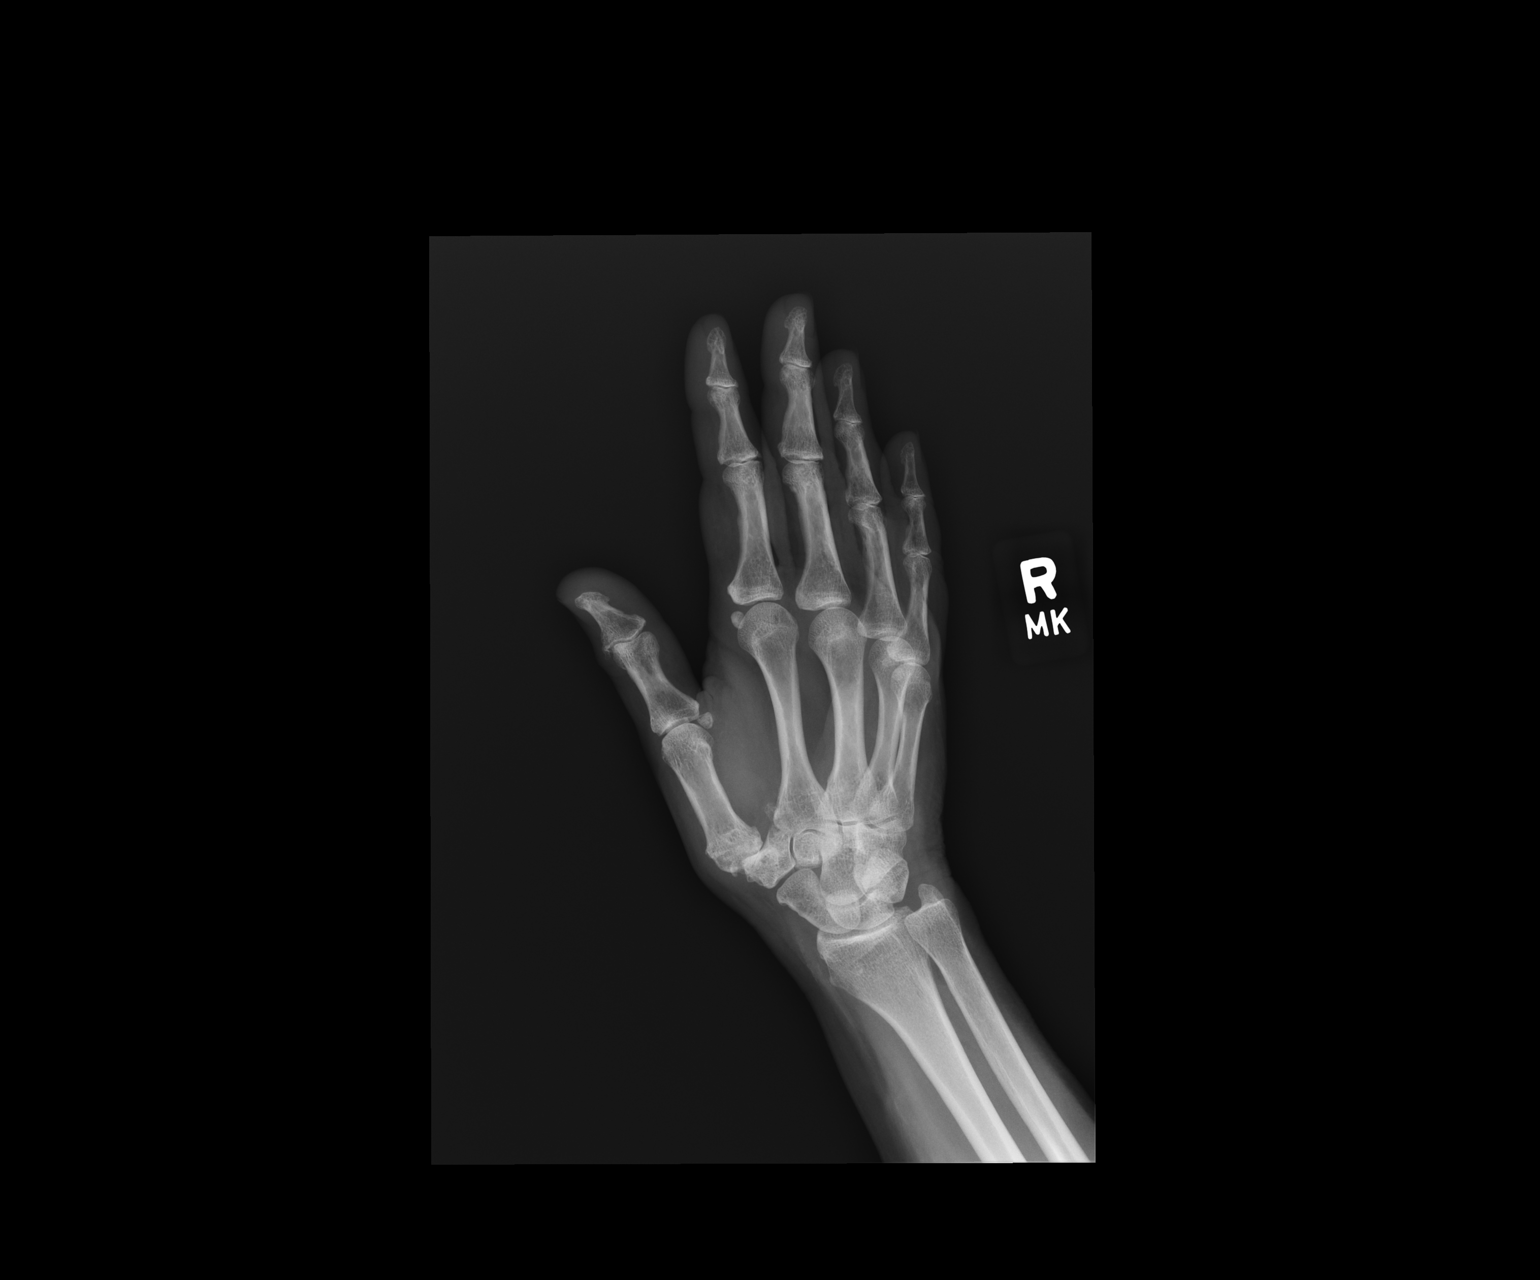
[im 3/3]
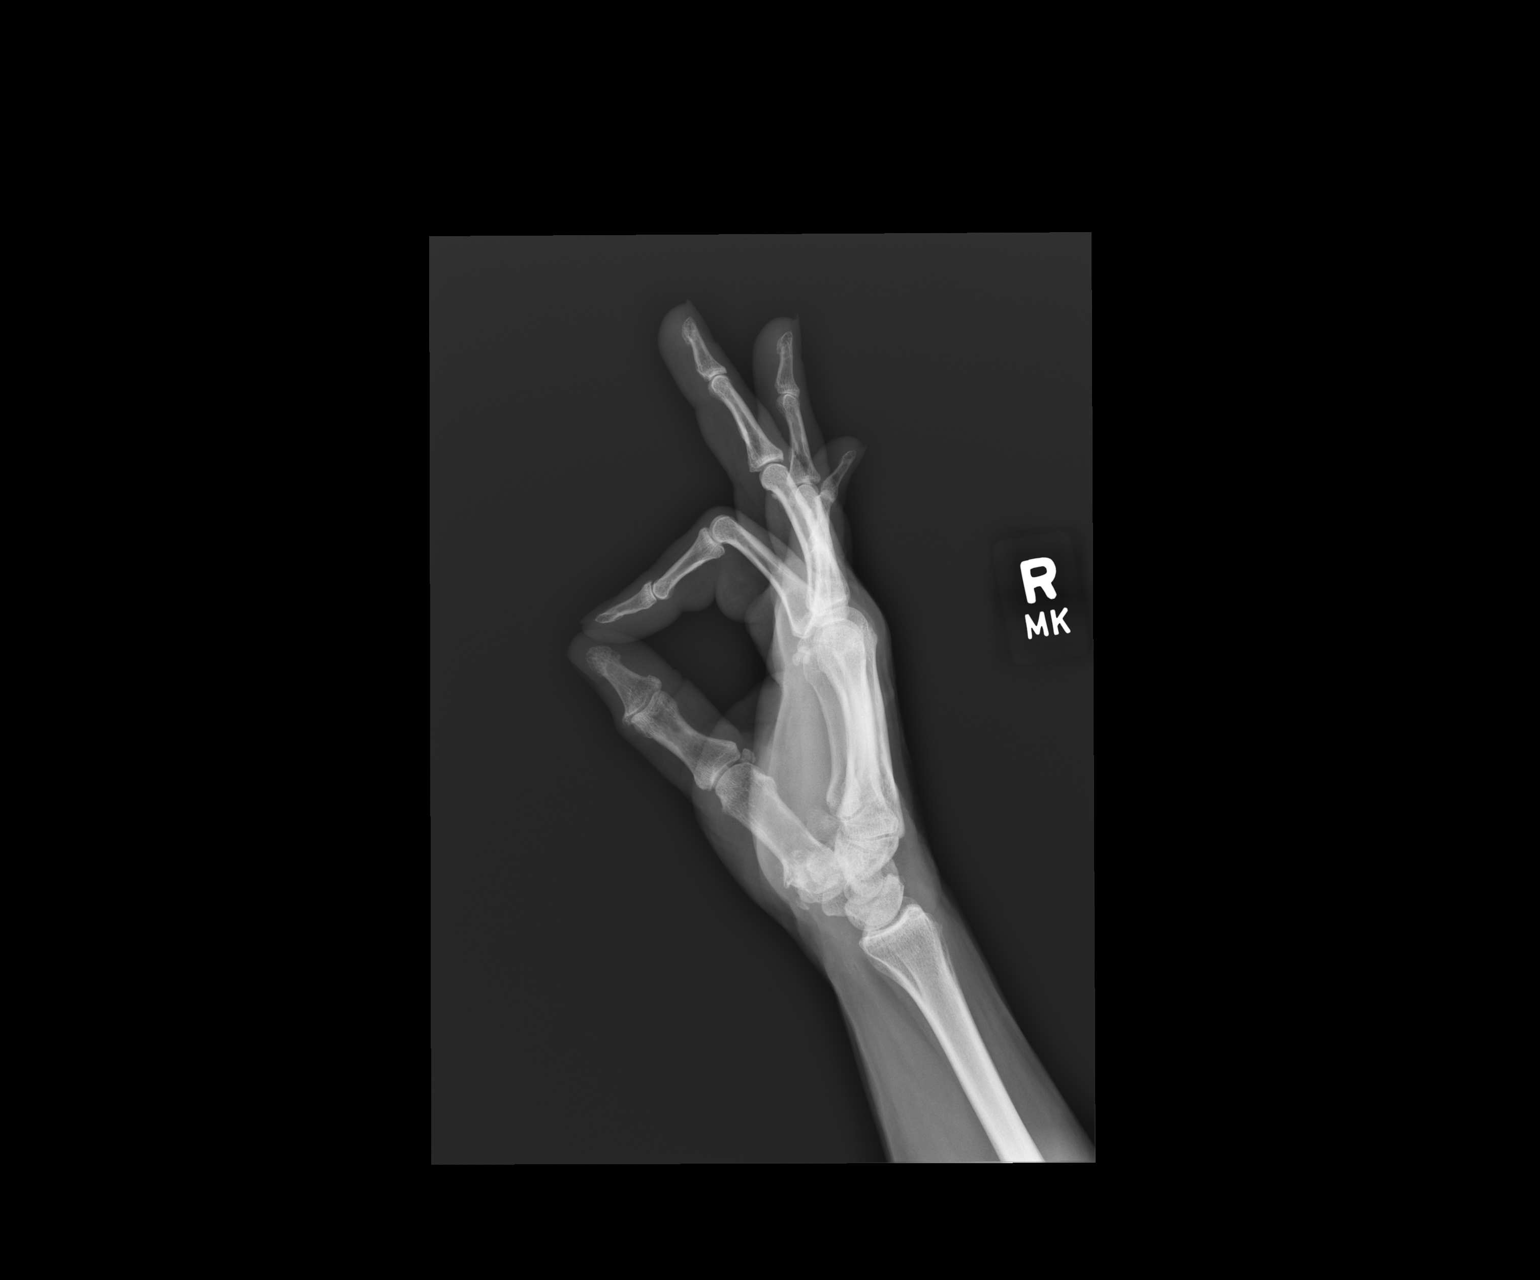

[3 of 3 positions shown; findings below may reference images not displayed]

FINDINGS: Moderately advanced joint space narrowing of the first CMC 
articulation with osseous remodeling, osteophytic spurring and osseous debris. 
No fracture. Normal alignment. Normal bone mineralization. No focal soft tissue 
swelling.
IMPRESSION: No acute osseous abnormality.

## 2021-05-30 IMAGING — MG MAMMOGRAPHY DIAGNOSTIC BILATERAL 3[PERSON_NAME]
8 series · 8 of 24 positions shown · non-contrast
Comparison: None

________________________________________________________________________________________________ 
MAMMOGRAPHY DIAGNOSTIC BILATERAL 3SIYAMEK PROJE, BREAST ULTRASOUND BILATERAL COMPLETE, 
05/30/2021 [DATE]: 
CLINICAL INDICATION: Recent fall with bilateral breast pain
TECHNIQUE: Digital bilateral mammograms and 3-D Tomosynthesis were obtained. 
These were interpreted both primarily and with the aid of computer-aided 
detection system. Bilateral breast sonography was performed as well.  
BREAST DENSITY: (Level C) The breasts are heterogeneously dense, which may 
obscure small masses.

[L CC]
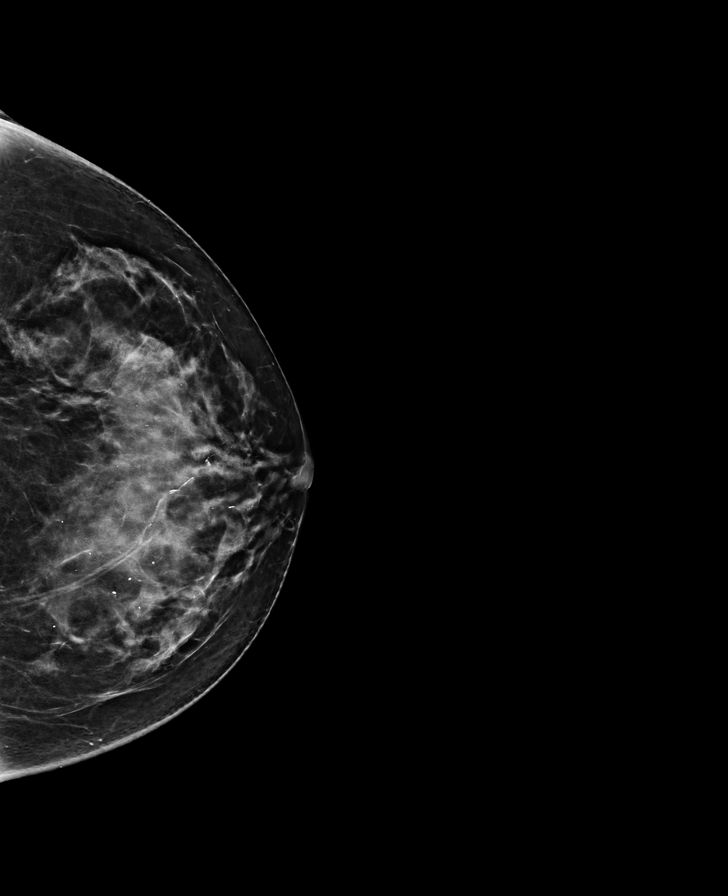

[R CC]
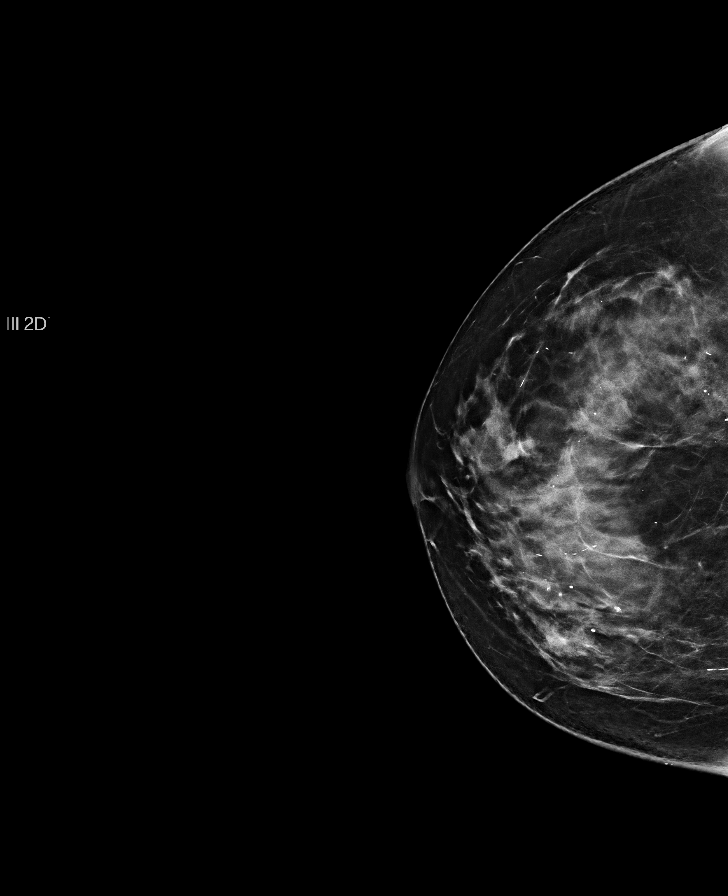

[R MLO]
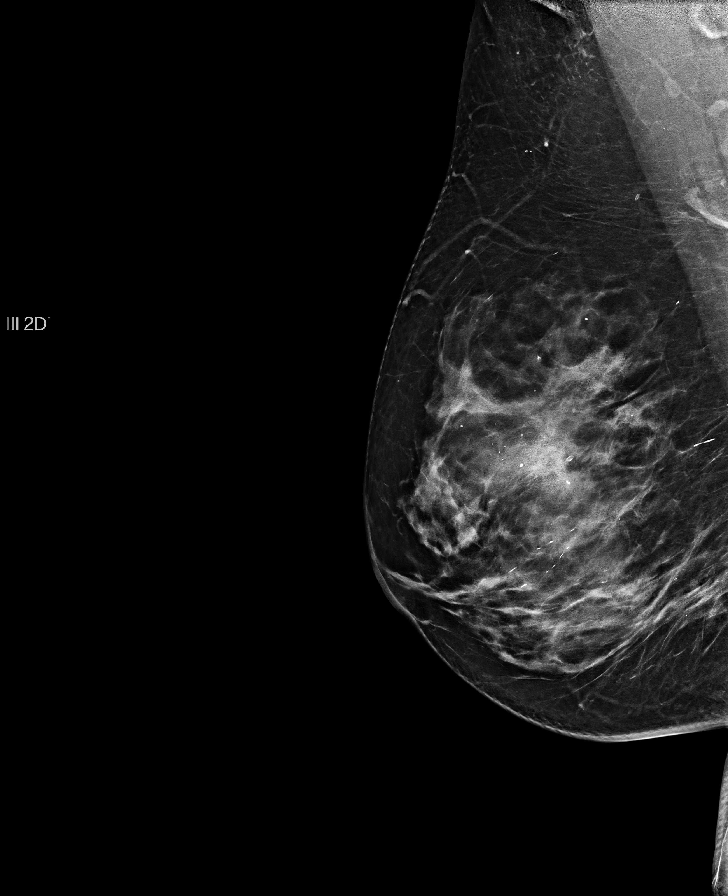

[L MLO]
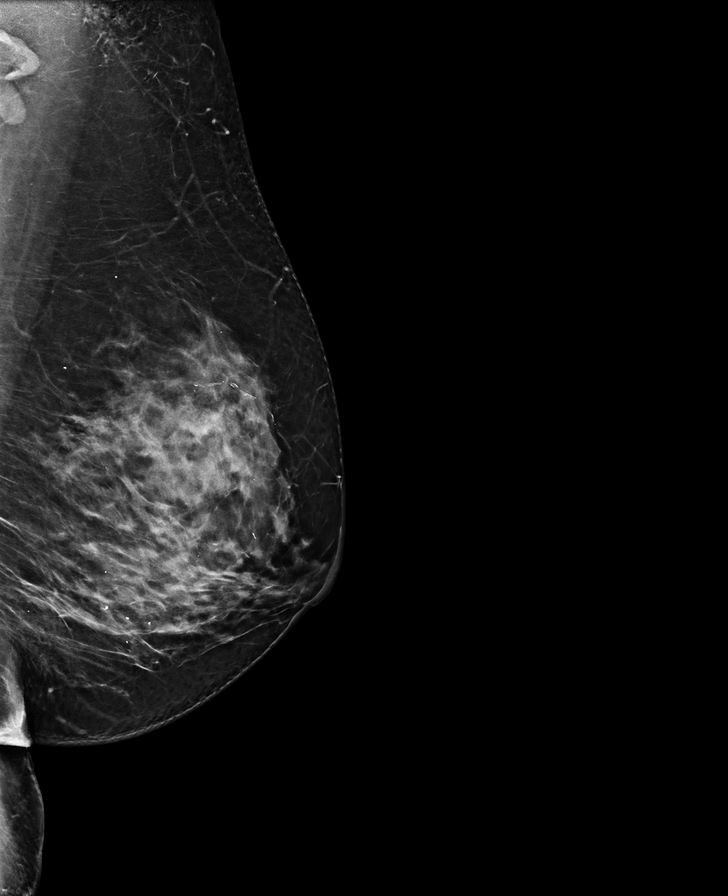

[R MLO tomo · tomo slice 40/79.0]
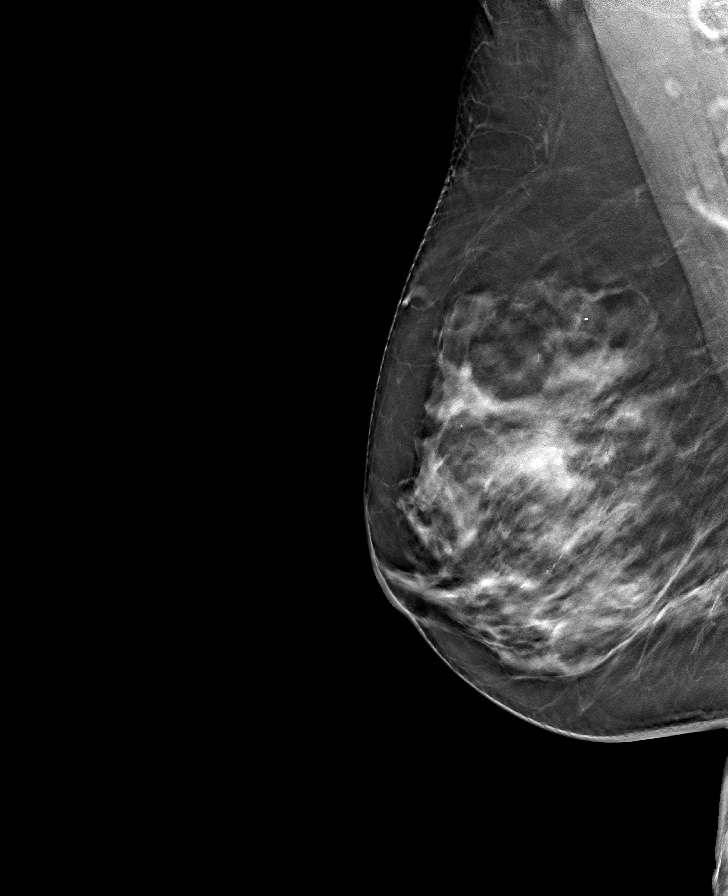

[R CC tomo · tomo slice 41/80.0]
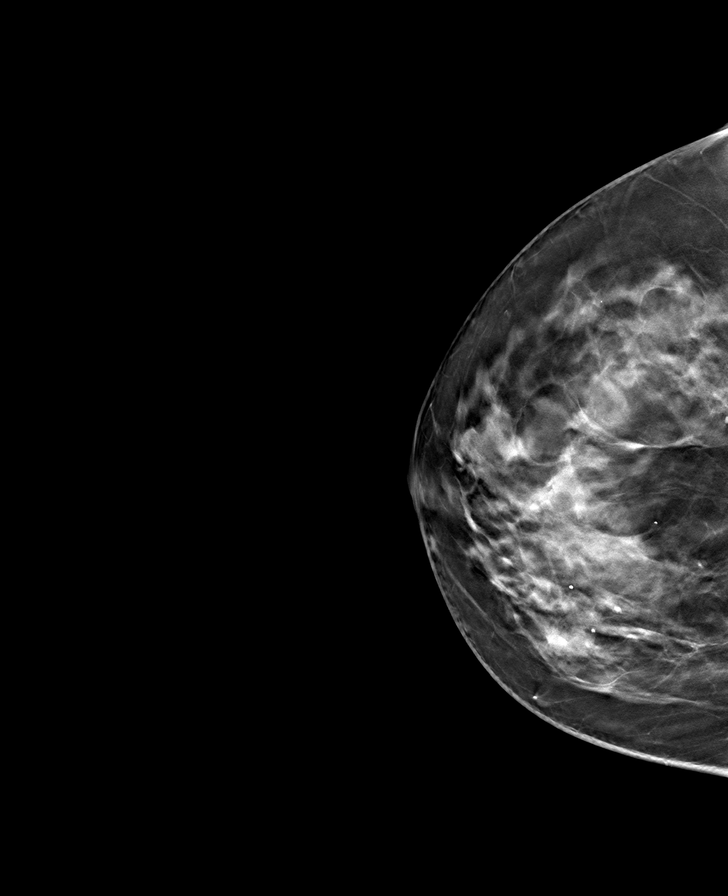

[L CC tomo · tomo slice 37/74.0]
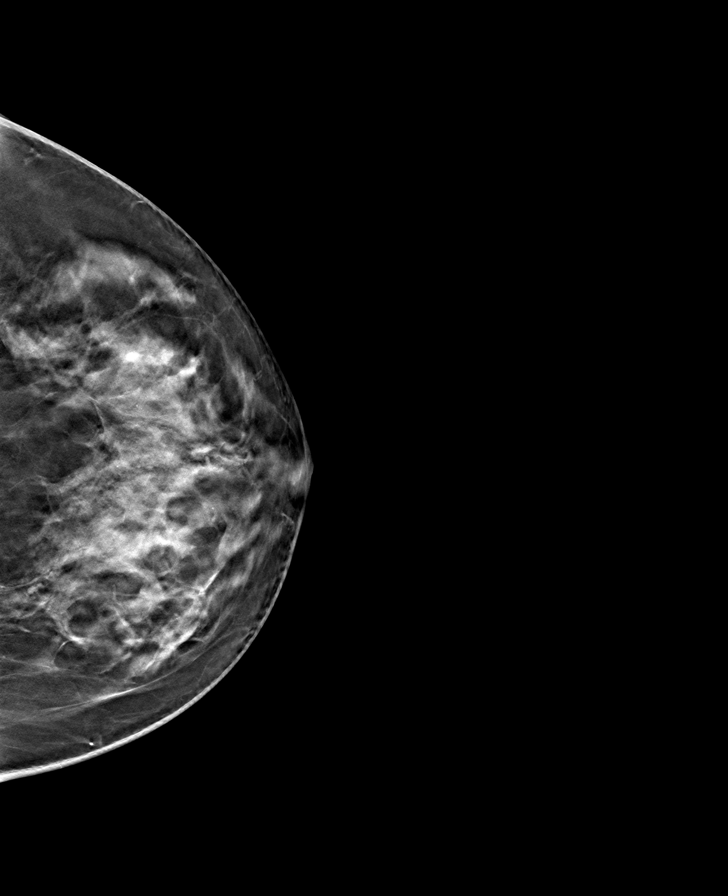

[L MLO tomo · tomo slice 39/77.0]
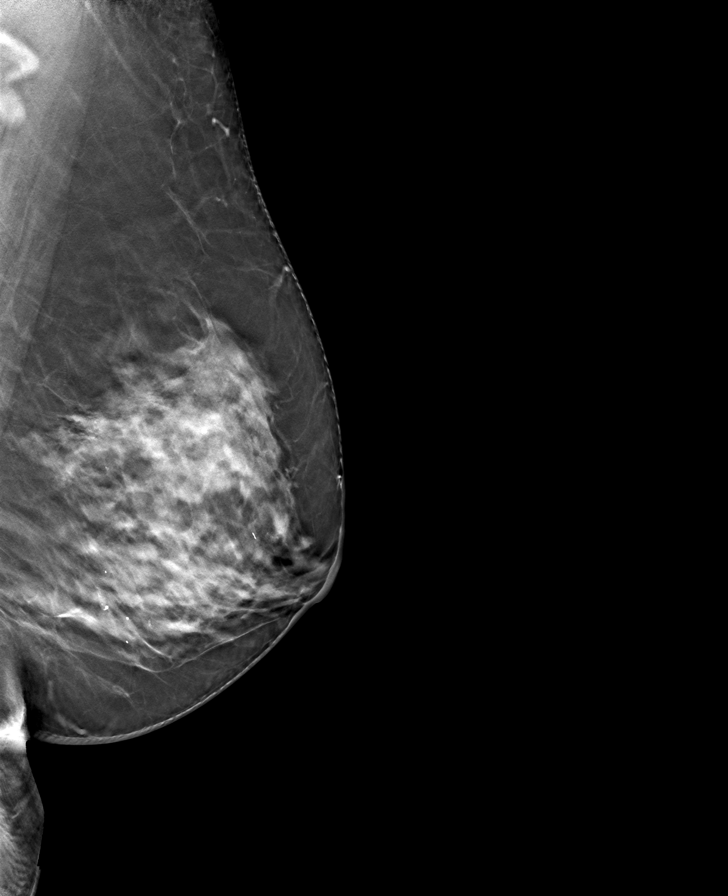

[8 of 24 positions shown; findings below may reference images not displayed]

FINDINGS: Benign calcifications are seen. No discrete mass identified. No 
abnormal skin thickening. 
Sonography revealed simple cyst. No suspicious abnormality seen. On the right in 
the [DATE] position 4 cm from the nipple is a 6 mm cyst and on the left in the 
[DATE] position 9 cm from the nipple is a 4 mm cyst.
IMPRESSION: Benign findings. 
(BI-RADS 2) Benign findings. Routine mammographic follow-up is recommended.

## 2022-01-23 IMAGING — CT CT CHEST WITH CONTRAST AND CT ABDOMEN W/WO CONTRAST
3 of 8 series · 11 of 46 positions shown, 17 images · IV contrast (ISOVUE 300)
Comparison: MR abdomen May 15, 2021.

________________________________________________________________________________________________ 
CT CHEST WITH CONTRAST AND CT ABDOMEN W/WO CONTRAST, 01/23/2022 [DATE]: 
CLINICAL INDICATION: Bilateral neck lumps for 3 months. Painful left lump. 
Abdominal pain. Pain radiates to both flanks. 
A search for DICOM formatted images was conducted for prior CT imaging studies 
completed at a non-affiliated media free facility.
TECHNIQUE: The chest was scanned with contrast and abdomen were scanned with and 
without contrast from base of neck through the aortic bifurcation with 150 mL of 
 Isovue 300 MDV contrast on a high resolution low dose CT scanner. 0 mL of 
Isovue 300 MDV were discarded.  Routine MPR and MIP 3D renderings were performed 
with concurrent physician supervision. The patients eGFR was calculated to be 
71.1 mL/min/1.73 m2 using the i-STAT device.

[Series 7: abd w/o 3.0 i41s 2 · axial · non-contrast · 0.70mm/px · z∈[-449,-410]mm · 2 of 92 slices shown]
[im 14/92  soft-tissue]
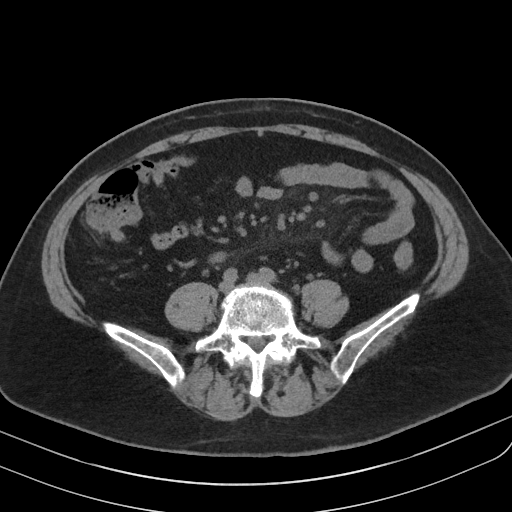
[im 27/92  soft-tissue]
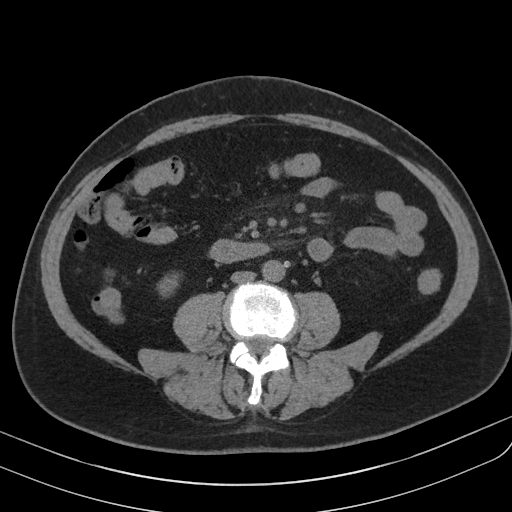

[Series 10: coronal · coronal · 0.62mm/px · 3 of 139 slices shown, 4 images]
[im 35/139  soft-tissue]
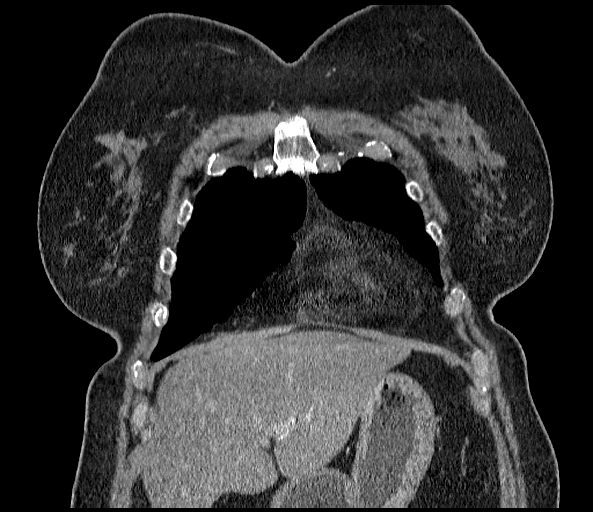
[im 70/139  soft-tissue]
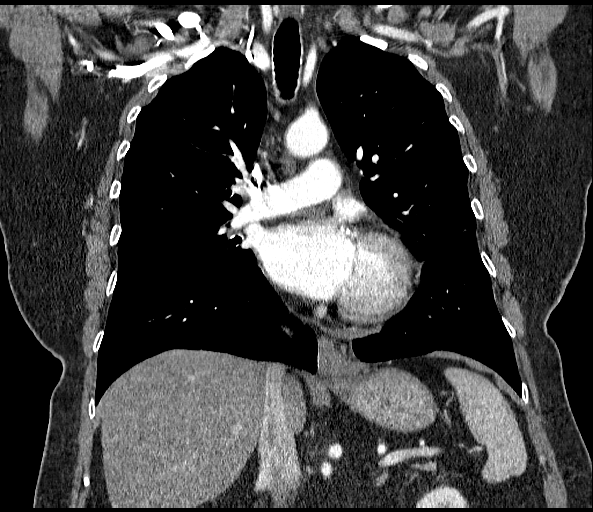
[im 70/139  bone]
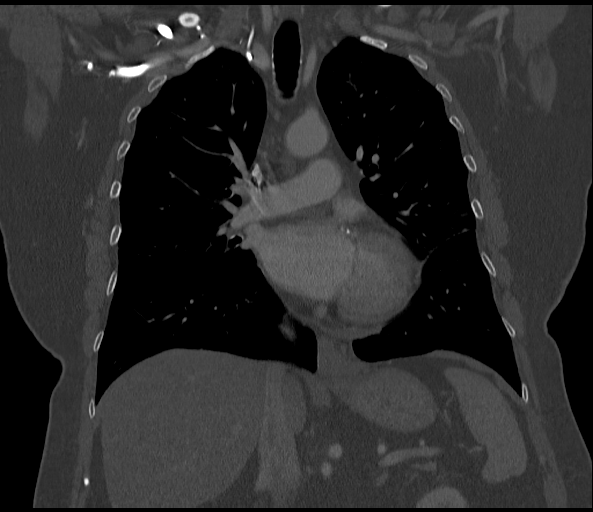
[im 104/139  soft-tissue]
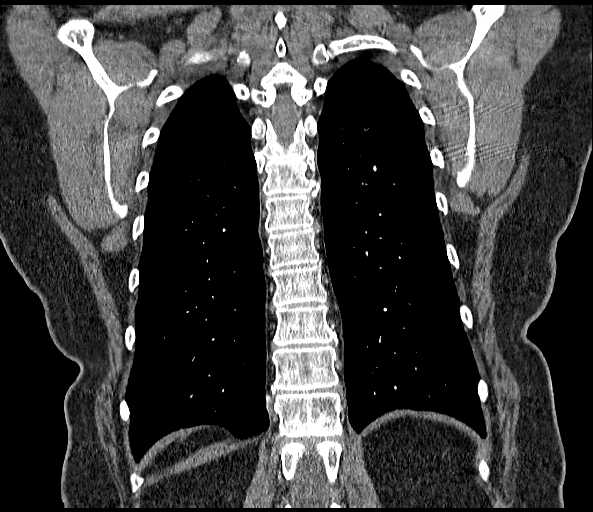

[Series 13: portal with 3.0 i41s 2 · axial · portal-venous · 0.70mm/px · z∈[-449,-254]mm · 6 of 92 slices shown, 11 images]
[im 14/92  soft-tissue]
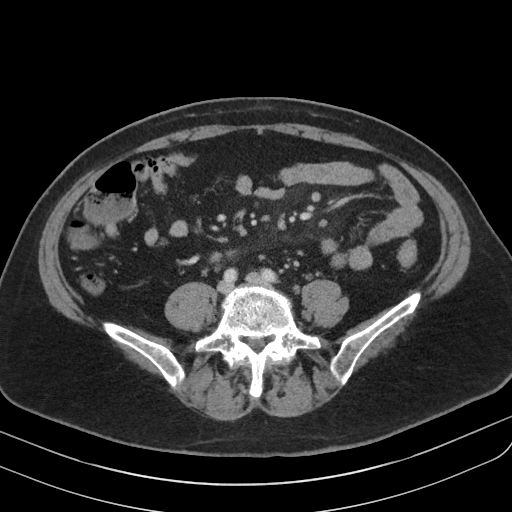
[im 14/92  bone]
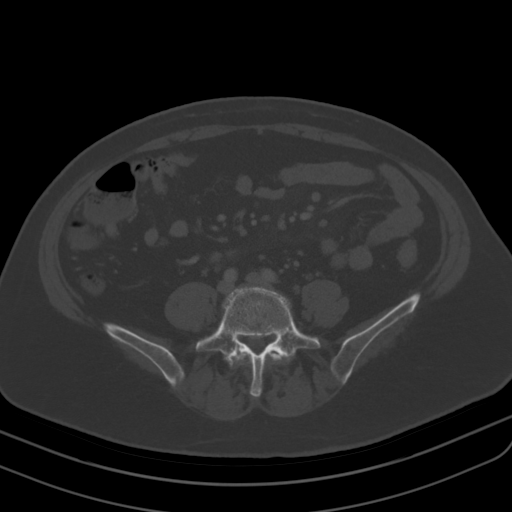
[im 27/92  soft-tissue]
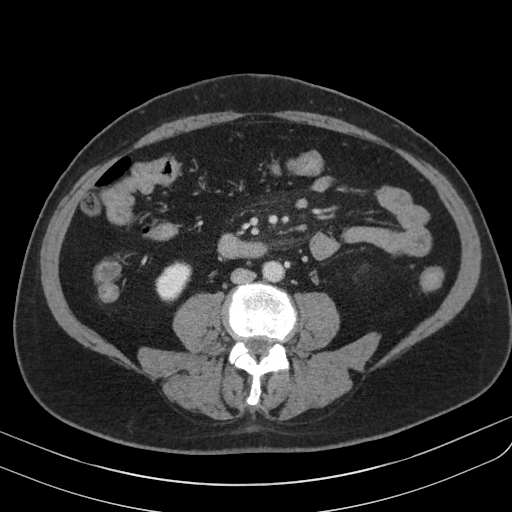
[im 40/92  soft-tissue]
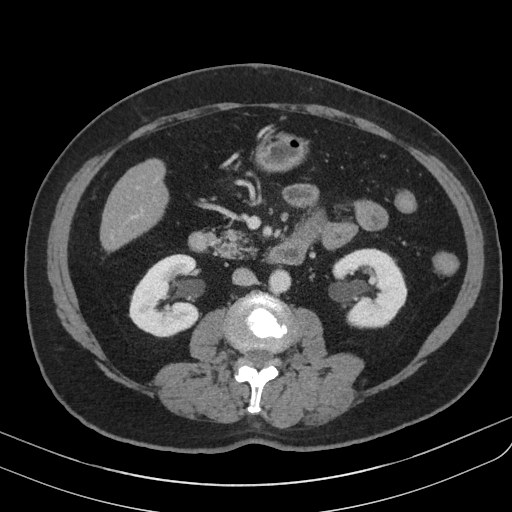
[im 40/92  lung]
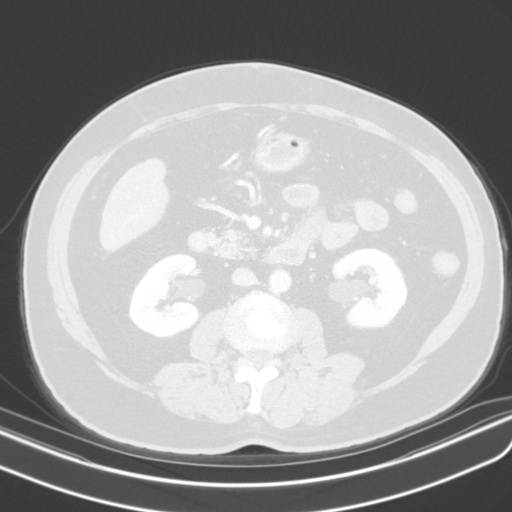
[im 53/92  soft-tissue]
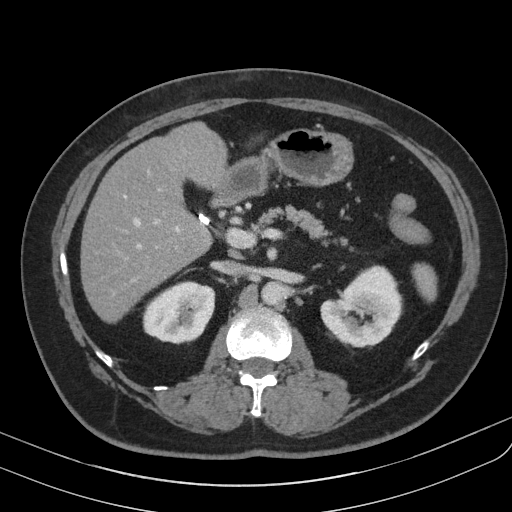
[im 53/92  lung]
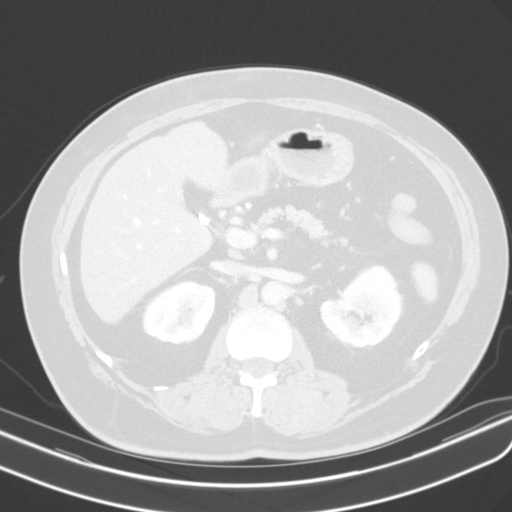
[im 66/92  soft-tissue]
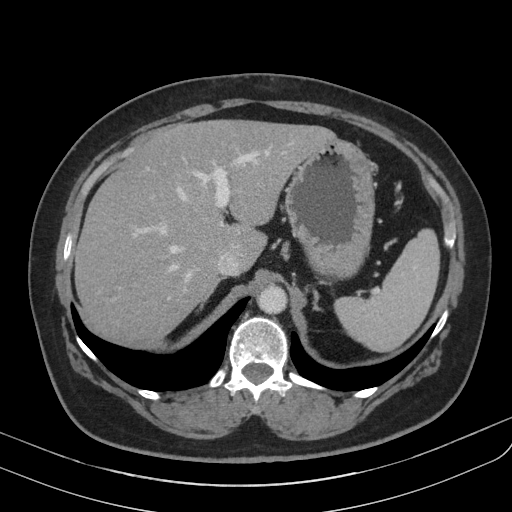
[im 66/92  lung]
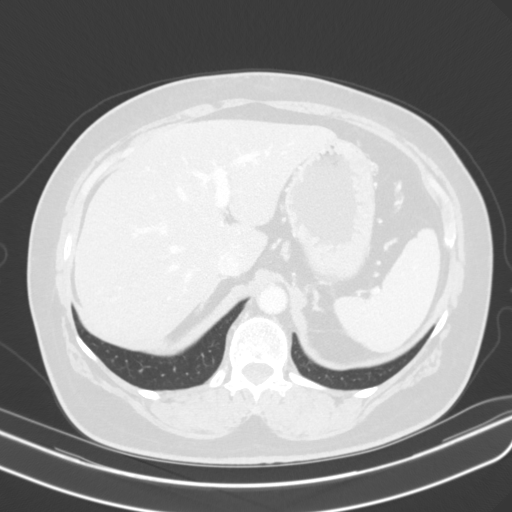
[im 79/92  soft-tissue]
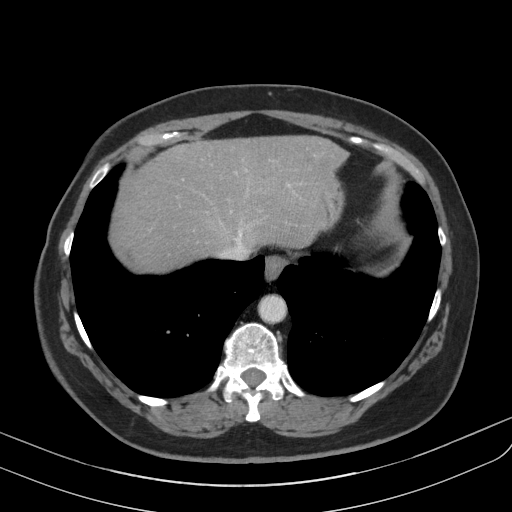
[im 79/92  lung]
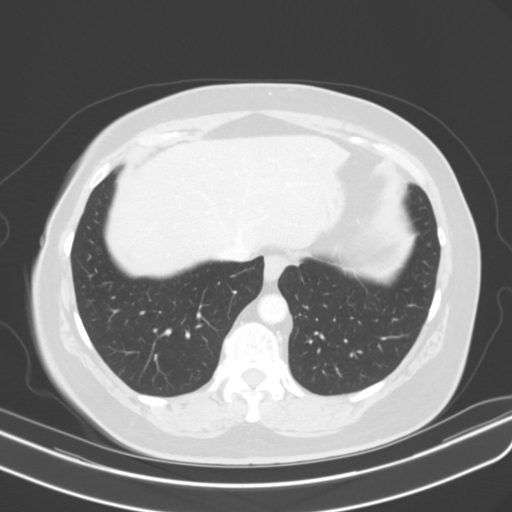

[11 of 46 positions shown; findings below may reference images not displayed]

FINDINGS: LUNGS AND PLEURA:  Minor scarring in the lingula. Lungs otherwise clear. No 
pleural effusion or infiltrate. 
MEDIASTINUM:  No adenopathy. Normal heart size. Small pericardial effusion. Mild 
to moderate coronary artery calcification. 7 mm nonenlarged distal esophageal 
lymph node image 109 series 8. 
CHEST WALL/AXILLA: No mass or adenopathy.  
HEPATOBILIARY: Hepatic steatosis. Cholecystectomy. 
SPLEEN: Normal in size. 
PANCREAS: No ductal dilatation or mass.   
ADRENALS: No mass. 
KIDNEYS: Bilateral lower pole renal cyst. No enhancing renal mass. Slight 
lateral renal scarring on the right. No urinary calculi. No hydronephrosis. 
LYMPH NODES: No discrete pathologic adenopathy by strict size or morphologic 
criteria. However, there are numerous, more numerous than typically visualized, 
nonenlarged mesenteric based lymph nodes associated with a misty mesentery. For 
instance see image 76 series 13. There are also small, nonpathologically 
enlarged retroperitoneal lymph nodes. 8 mm nonenlarged portacaval lymph node. 
STOMACH, SMALL BOWEL AND COLON: Visualized bowel appears unremarkable. 
VASCULAR STRUCTURES: No aneurysm.  
MUSCULOSKELETAL: No acute osseous abnormality. Scattered degenerative changes. 
Hemangioma L4. 
ADDITIONAL FINDINGS: None,
IMPRESSION: Please note that the pelvis was not scanned. Order stated CT chest and abdomen. 
Consider further scanning of the pelvis. 
No acute findings within the abdomen or pelvis. 
However, there is no evidence of pathologic adenopathy, there are numerous, more 
numerous than typically visualized, non-enlarged mesenteric based lymph nodes 
associated with a misty mesentery. The findings are nonspecific and 
indeterminate, although correlate with laboratory markers as early 
lymphoproliferative disorder could present in this fashion. Consider follow-up 
study in 2-3 months to further assess. 
Other incidental findings as above. 
RADIATION DOSE REDUCTION: All CT scans are performed using radiation dose 
reduction techniques, when applicable.  Technical factors are evaluated and 
adjusted to ensure appropriate moderation of exposure.  Automated dose 
management technology is applied to adjust the radiation doses to minimize 
exposure while achieving diagnostic quality images.

## 2022-01-23 IMAGING — CT CT NECK WITH CONTRAST
3 of 5 series · 13 of 33 positions shown, 16 images · IV contrast (ISOVUE 300)
Comparison: CT chest and abdomen from today.   CT orbits May 16, 2021.

________________________________________________________________________________________________ 
CT NECK WITH CONTRAST, 01/23/2022 [DATE]: 
CLINICAL INDICATION: Bilateral neck lumps for 3 months. Painful left lump. 
A search for DICOM formatted images was conducted for prior CT imaging studies 
completed at a non-affiliated media free facility.
TECHNIQUE: The neck was scanned from the level of the maxillary sinuses through 
the AP Window with 150 mL of 7sovue-TWW injected intravenously on a 
high-resolution CT scanner using dose reduction techniques.  Routine MPR 
reconstructions were performed.

[Series 18: neck w 0.75 (person_name)31(person_name) · axial · 0.54mm/px · z∈[-266,-83]mm · 5 of 393 slices shown, 7 images]
[im 66/393  soft-tissue]
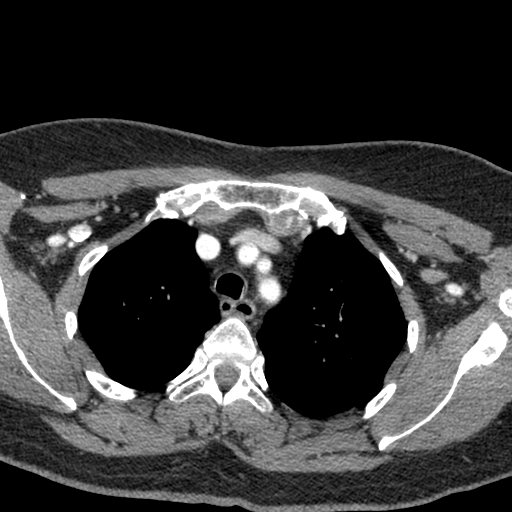
[im 66/393  bone]
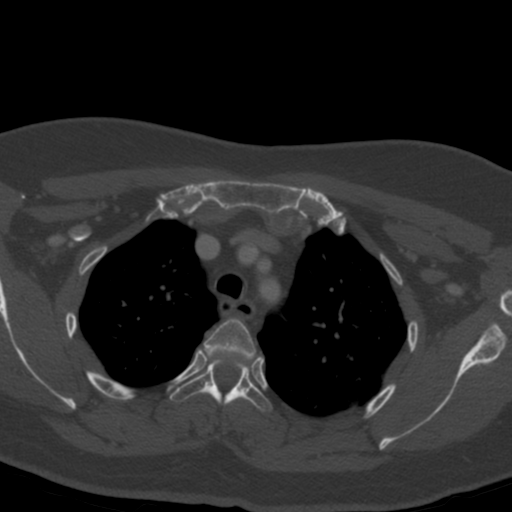
[im 131/393  bone]
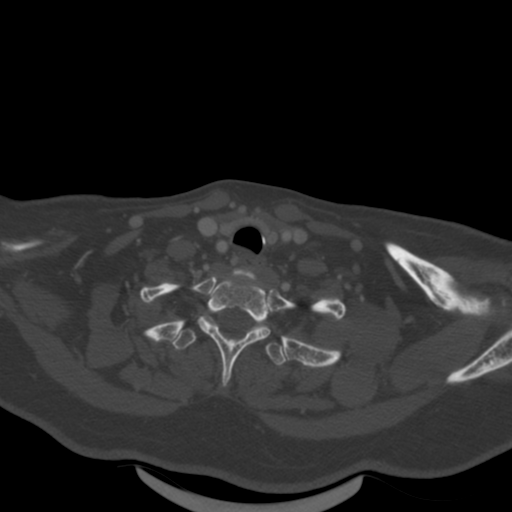
[im 197/393  bone]
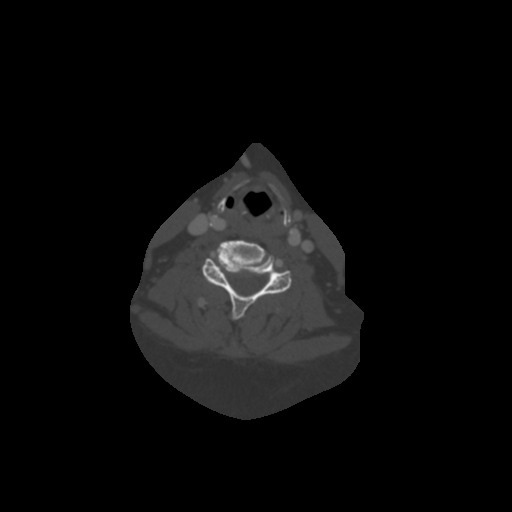
[im 262/393  bone]
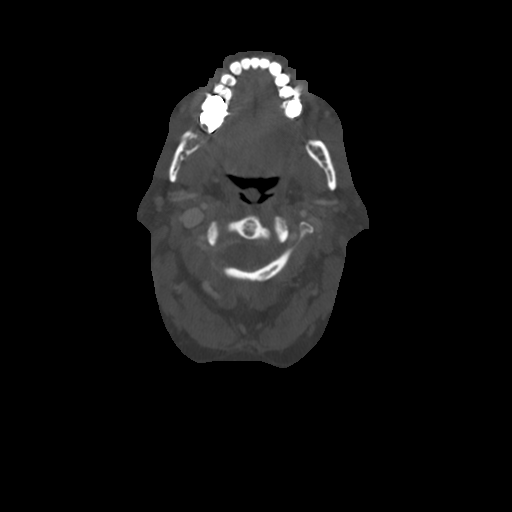
[im 327/393  soft-tissue]
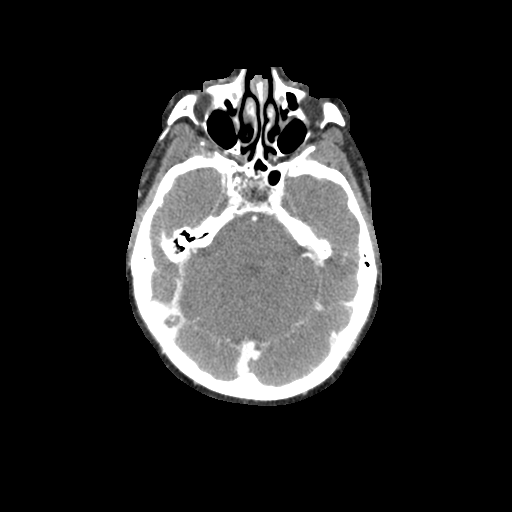
[im 327/393  bone]
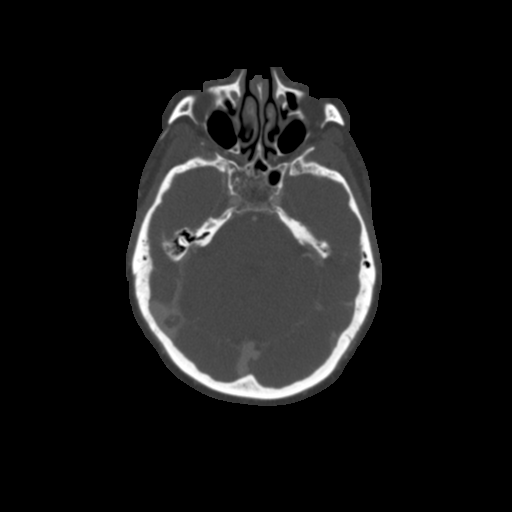

[Series 19: coronal · coronal · 0.54mm/px · 3 of 354 slices shown]
[im 71/354  bone]
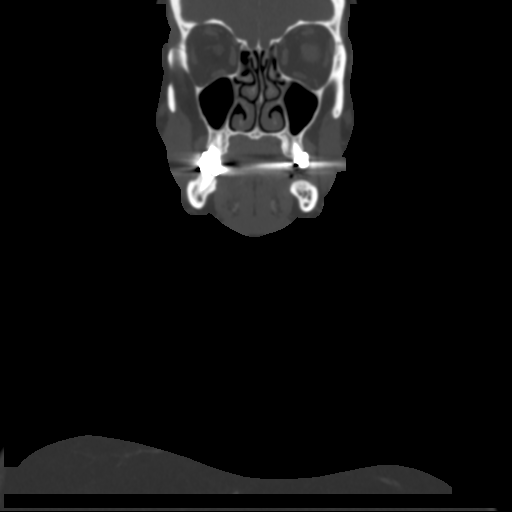
[im 142/354  bone]
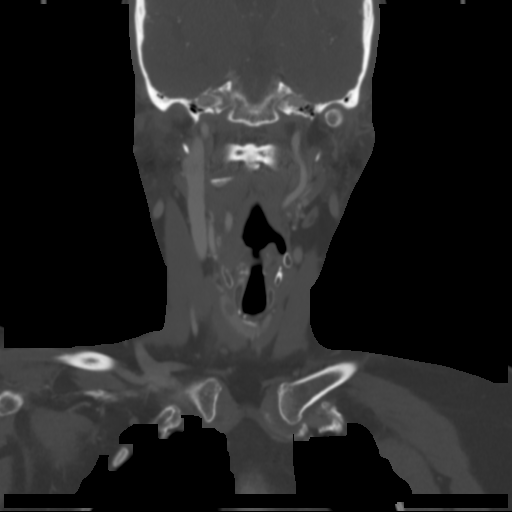
[im 212/354  bone]
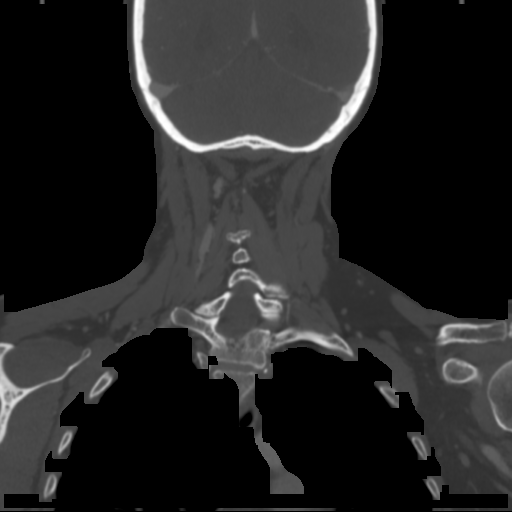

[Series 20: sag · sagittal · 0.48mm/px · 5 of 373 slices shown, 6 images]
[im 125/373  bone]
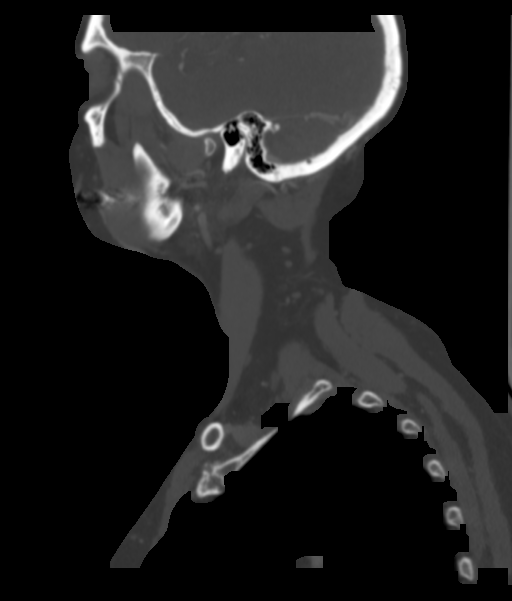
[im 156/373  bone]
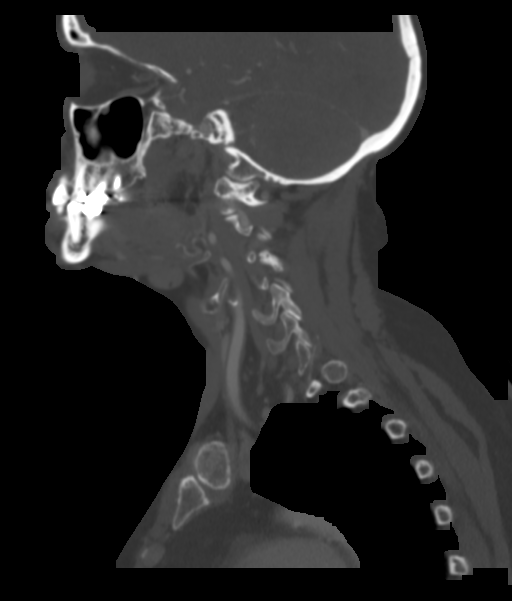
[im 187/373  soft-tissue]
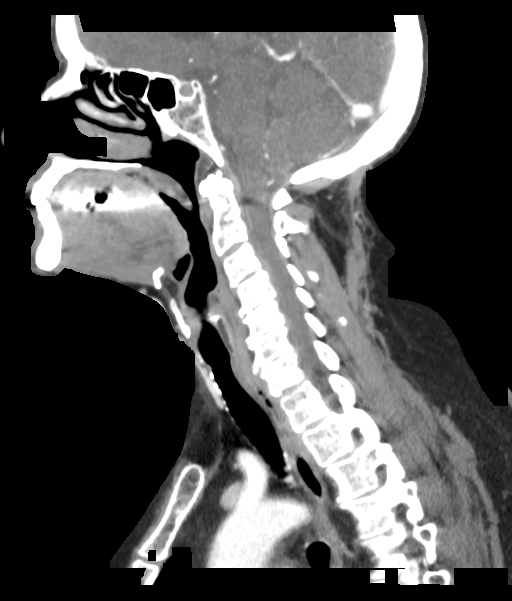
[im 187/373  bone]
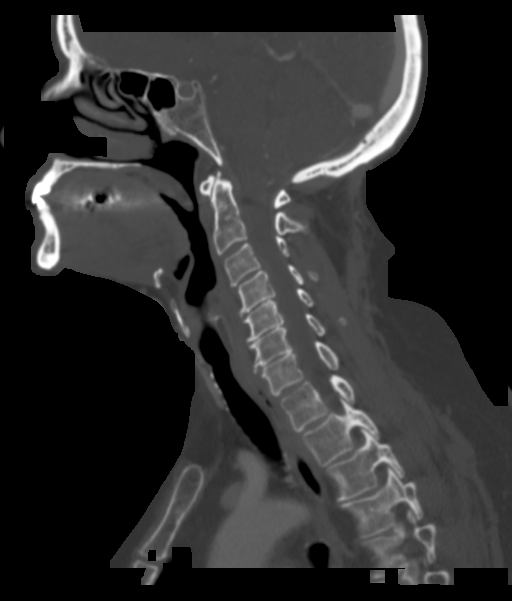
[im 218/373  bone]
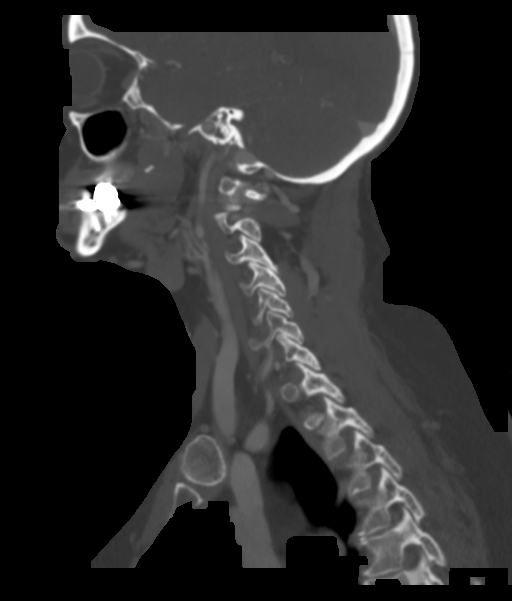
[im 249/373  bone]
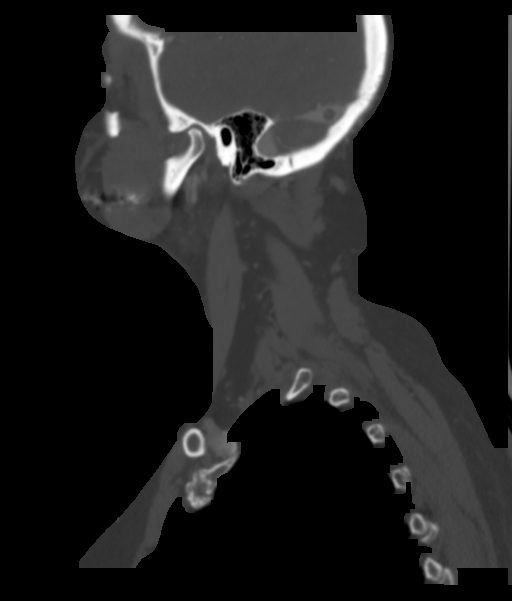

[13 of 33 positions shown; findings below may reference images not displayed]

FINDINGS: -------------------------------------------------------------------------------- 
-------------------- 
NECK SOFT TISSUES: Remaining markers were applied at the site of clinical 
concern. Markers are placed bilaterally over the anterior soft tissues of the 
upper chest. There is no evidence of soft tissue or osseous finding to explain 
the markers. Moderate cervical spine degenerative changes are noted. Additional 
soft tissue markers are placed along the lateral aspects of the immediate 
infrahyoid neck. There are small, nonpathologically enlarged lymph nodes 
identified within the posterior triangle regions but without other soft tissue 
mass lesion. 
AERODIGESTIVE: Mild soft tissue prominence at the base of tongue is most likely 
reflective of lingual tonsillar hypertrophy. Consider correlation with direct 
visualization of this region. Patency without evidence of large mucosal lesion. 
No glottic lesions are visualized. Symmetric vocal cords. 
SALIVARY GLANDS: Subcentimeter intraparotid nodules are present bilaterally, 
nonspecific but could reflect nonenlarged bilateral intraparotid lymph nodes. 
Parotid glands otherwise unremarkable. 
THYROID: 7 mm low-attenuation right thyroid nodule. 
VESSELS: Grossly patent noting vascular calcifications. 
LYMPH NODES: No pathologic lymph nodes by size or morphology criteria.  
Nonenlarged level 2 lymph nodes also present. There is a nonenlarged 
subcutaneous lymph node measuring 6 mm at the C1 level along the right 
posterolaterally, image 117 series 18. There are other, smaller nonenlarged 
subcutaneous lymph nodes elsewhere. 
SOFT TISSUES: Parapharyngeal spaces are unremarkable. No retropharyngeal 
collections. 
-------------------------------------------------------------------------------- 
------------------- 
OTHER: 
VISUALIZED INTRACRANIAL: Linear type enhancement extending craniocaudally 
through the left temporal lobe likely reflects an incidental developmental 
venous anomaly draining into the left cavernous sinus region.   
ORBITS/SINUSES/T-BONES: Visualized orbits are unremarkable. Visualized paranasal 
sinuses and mastoid air cells are clear. 
OSSEOUS: Cervical degenerative changes. 
UPPER CHEST: Visualized lung apices are clear. Visualized mediastinum and upper 
chest wall are unremarkable. 
-------------------------------------------------------------------------------- 
-------------------
IMPRESSION: No focal soft tissue mass lesion or pathologic adenopathy to account for the 
sites of clinical concern as. There are nonenlarged neck lymph nodes noted as 
detailed above. 
Mild soft tissue prominence base of tongue is most likely reflective of lingual 
tonsillar hypertrophy, although correlation with direct visualization 
recommended. 
7 millimeter low-attenuation right thyroid nodule. Typically nodules of this 
size do not require further follow-up. 
Other incidental findings as above. 
RADIATION DOSE REDUCTION: All CT scans are performed using radiation dose 
reduction techniques, when applicable.  Technical factors are evaluated and 
adjusted to ensure appropriate moderation of exposure.  Automated dose 
management technology is applied to adjust the radiation doses to minimize 
exposure while achieving diagnostic quality images.

## 2022-05-08 IMAGING — CT CT ABDOMEN AND PELVIS WITH CONTRAST
2 of 3 series · 15 of 46 positions shown, 17 images · IV contrast (APPLIED)
Comparison: 05/15/2021 MRI abdomen

________________________________________________________________________________________________ 
CT ABDOMEN AND PELVIS WITH CONTRAST, 05/08/2022 [DATE]: 
A search for DICOM formatted images was conducted for prior CT imaging studies 
completed at a non-affiliated media free facility.  
CLINICAL INDICATION: Epigastric pain.
TECHNIQUE: The abdomen and pelvis was scanned from lung bases through the pubic 
rami with 75 mL of Isovue 300 on a high-resolution CT scanner using dose 
reduction techniques.  25 mL of Isovue 300 were discarded. Routine MPR 
reconstructions were performed. Count of known CT and Cardiac Nuclear Medicine 
studies performed in the previous 12 months = 3.

[Series 4: axial · axial · 0.69mm/px · z∈[-455,-62]mm · 12 of 151 slices shown, 14 images]
[im 10/151  soft-tissue]
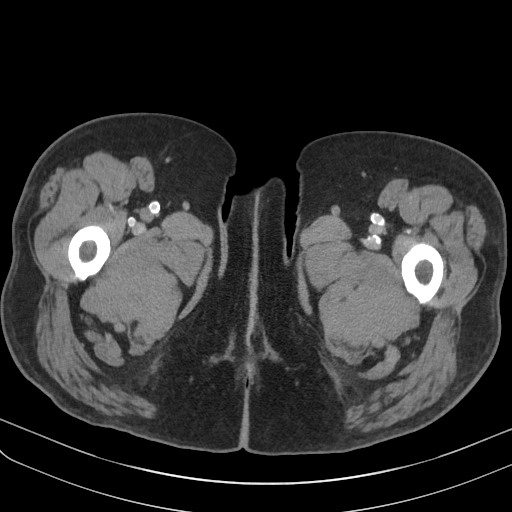
[im 10/151  bone]
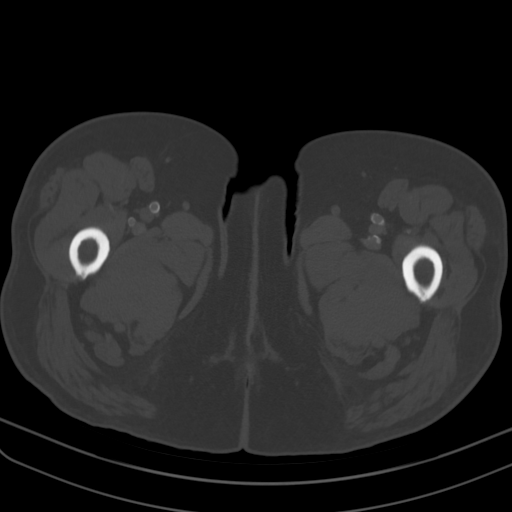
[im 20/151  soft-tissue]
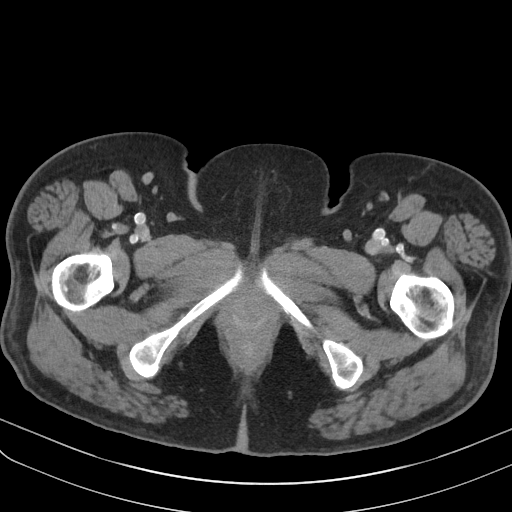
[im 34/151  soft-tissue]
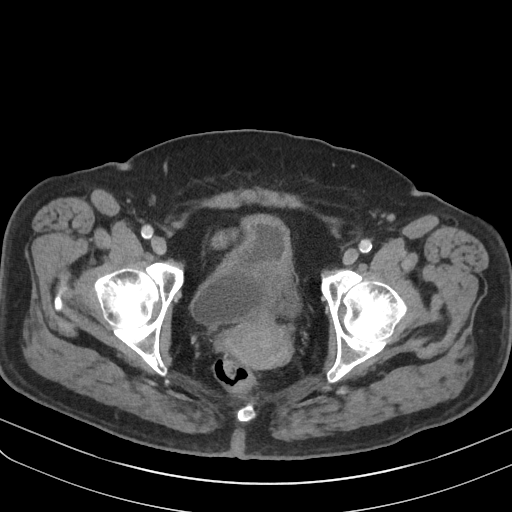
[im 44/151  soft-tissue]
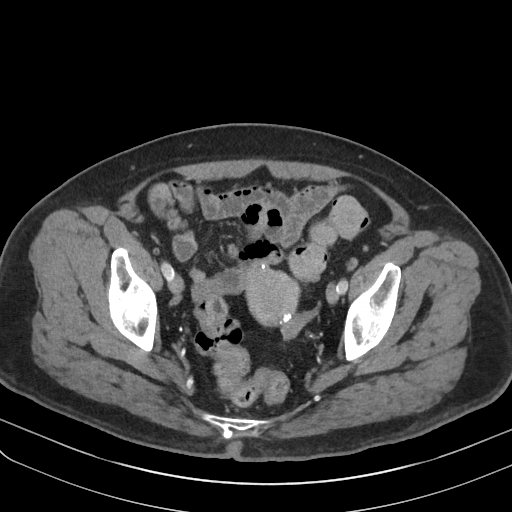
[im 59/151  soft-tissue]
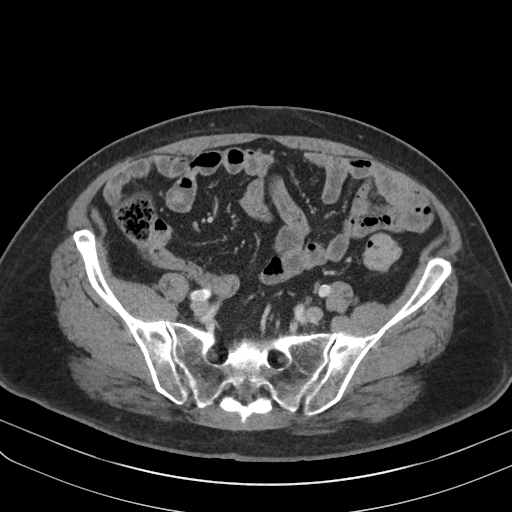
[im 68/151  soft-tissue]
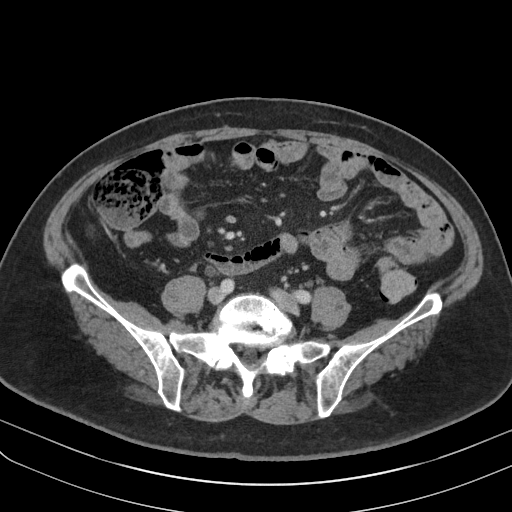
[im 83/151  soft-tissue]
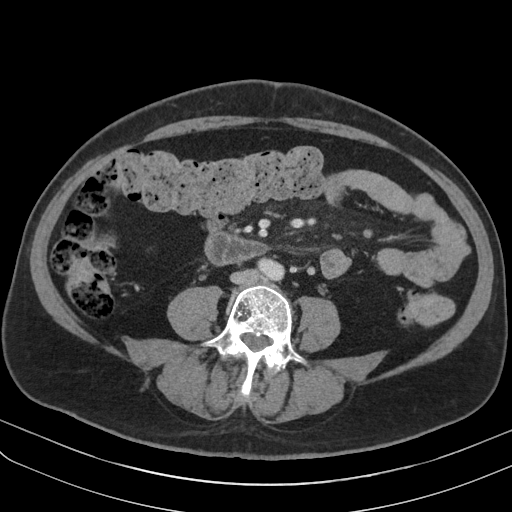
[im 92/151  soft-tissue]
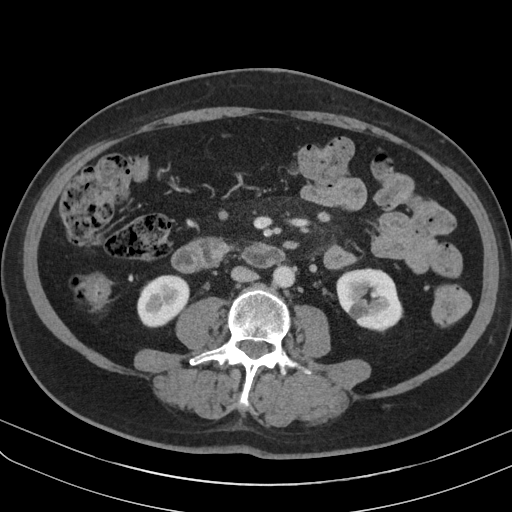
[im 107/151  soft-tissue]
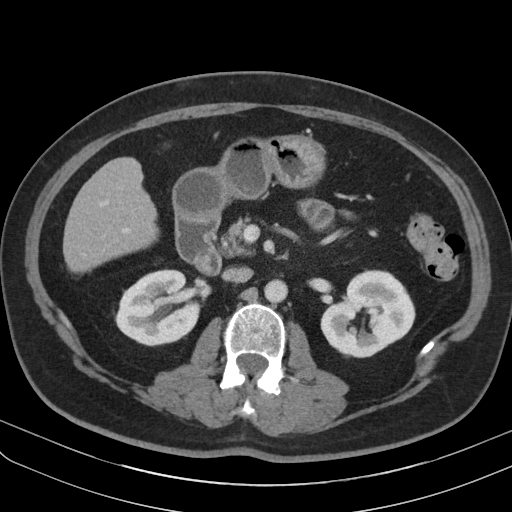
[im 107/151  bone]
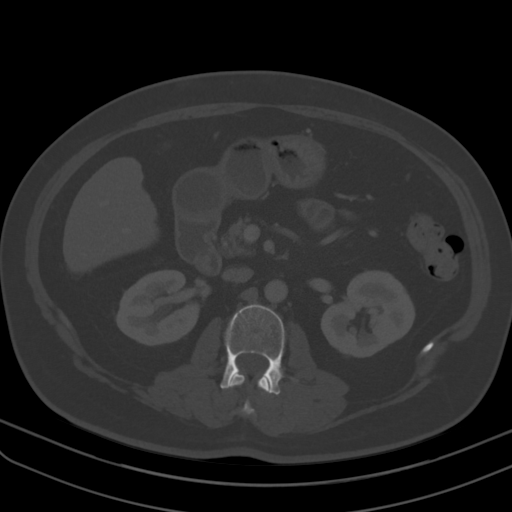
[im 117/151  soft-tissue]
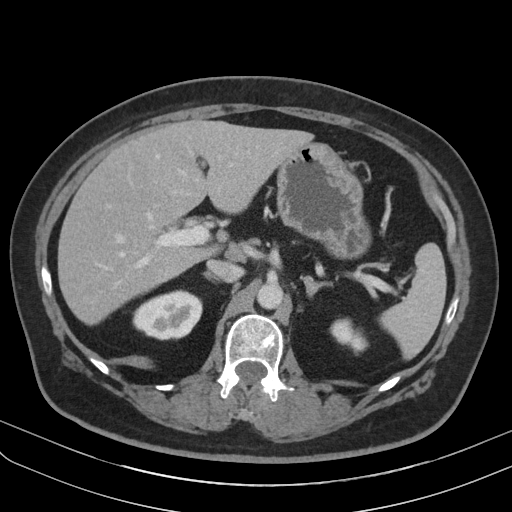
[im 131/151  soft-tissue]
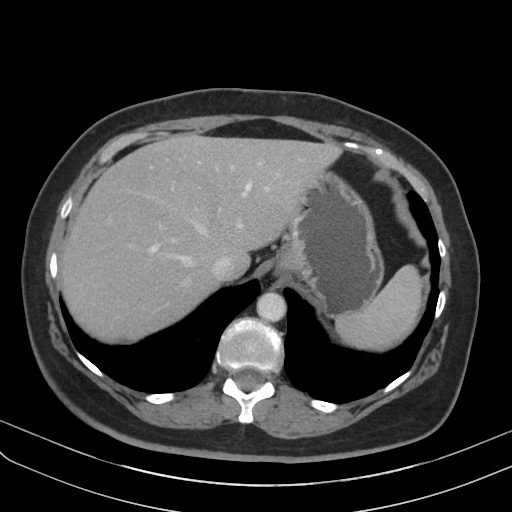
[im 141/151  soft-tissue]
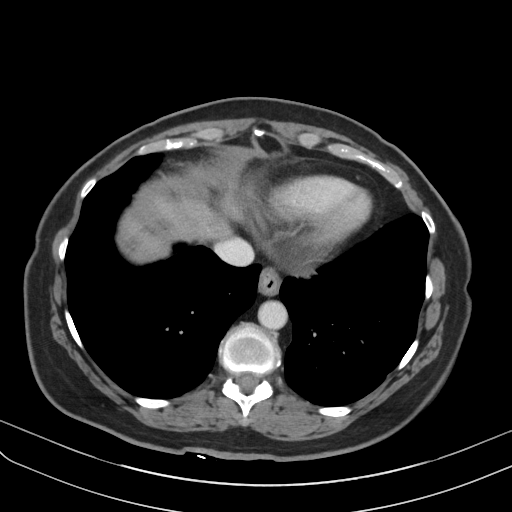

[Series 5: cor · coronal · 0.70mm/px · 3 of 124 slices shown]
[im 42/124  soft-tissue]
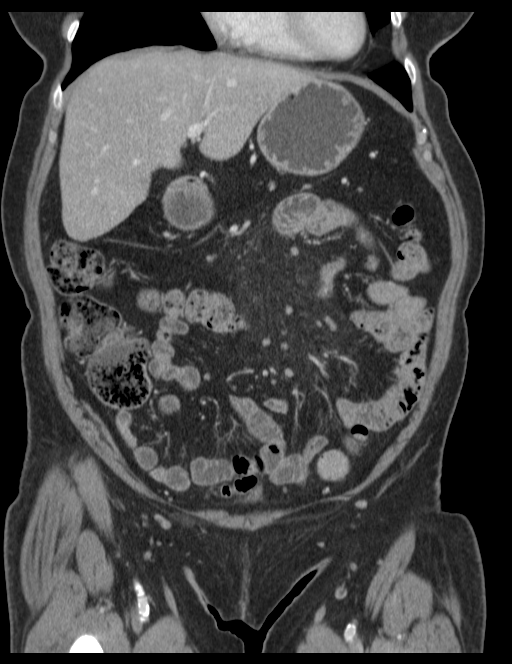
[im 55/124  soft-tissue]
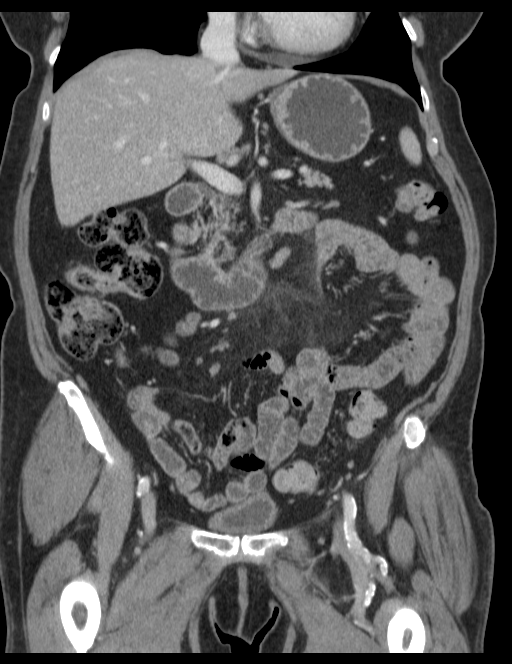
[im 69/124  soft-tissue]
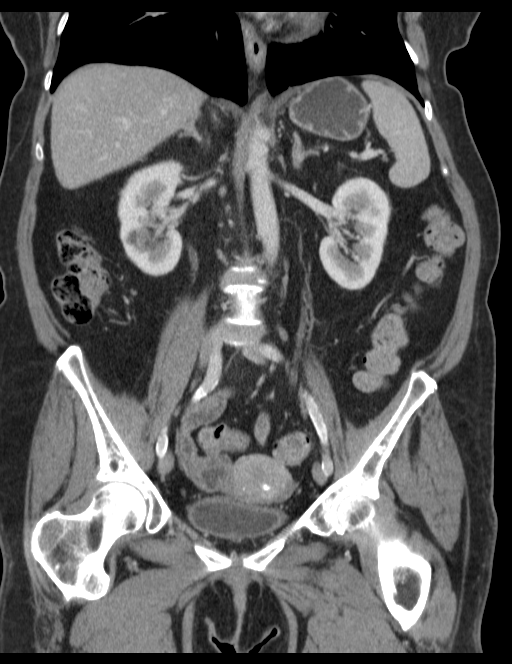

[15 of 46 positions shown; findings below may reference images not displayed]

FINDINGS: LUNG BASES: Lingular atelectasis. No pleural effusions. 
HEPATOBILIARY: No mass or biliary dilatation. Cholecystectomy. 
SPLEEN: Normal in size. 
PANCREAS: No ductal dilatation or mass.   
ADRENALS: No mass. 
GENITOURINARY: No enhancing mass or hydronephrosis.  Minimal nonspecific right 
renal pelvic urothelial wall enhancement, of uncertain clinical significance. 
Renal cysts. Mild bladder wall thickening (up to 0.5 cm). Fibroid uterus with 
calcified fibroids measuring up to 1.0 cm. 
LYMPH NODES: No adenopathy. Small mesenteric lymph nodes measure up to 1.2 x
cm. Small portacaval lymph nodes measure up to 1.4 x 0.7 cm. 
STOMACH, SMALL BOWEL AND COLON: Tiny hiatal hernia. No bowel wall thickening or 
obstruction. 
VASCULAR STRUCTURES: No aneurysm. Atherosclerosis. 
MUSCULOSKELETAL: No acute osseous abnormality. Scattered degenerative changes.  
OTHER: Mild central mesenteric fat stranding.
IMPRESSION: 1.  Tiny hiatal hernia.  
2.  Cholecystectomy. 
3.  Minimal nonspecific right renal pelvic urothelial wall enhancement, of 
uncertain clinical significance.  
4.  Renal cysts.  
5.  Mild bladder wall thickening. 
6.  Fibroid uterus. 
7.  Mild central mesenteric fat stranding and small mesenteric lymph nodes.  
8.  Atherosclerosis. 
9.  Degenerative change. 
RADIATION DOSE REDUCTION: All CT scans are performed using radiation dose 
reduction techniques, when applicable.  Technical factors are evaluated and 
adjusted to ensure appropriate moderation of exposure.  Automated dose 
management technology is applied to adjust the radiation doses to minimize 
exposure while achieving diagnostic quality images.

## 2022-05-23 IMAGING — MR MRI BRAIN W/WO CONTRAST
11 of 15 series · 32 of 48 positions shown · IV contrast (gadavist)
Comparison: CT neck January 23, 2022, CT orbits May 16, 2021.

________________________________________________________________________________________________ 
MRI BRAIN W/WO CONTRAST, 05/23/2022 [DATE]: 
CLINICAL INDICATION: Cognitive impairment. Fall with head trauma May 2021. 
Lump right forehead. Short-term memory loss.
TECHNIQUE: Multiplanar, multiecho position MR images of the brain were performed 
without and with 6.5 mL of Gadavist were injected intravenously by hand. 1.0 mL 
of Gadavist were discarded.  Patient was scanned on a 3T magnet.

[Series 101: survey · axial · 10.0mm · 0.98mm/px · 1 of 5 slices shown]
[im 1/5]
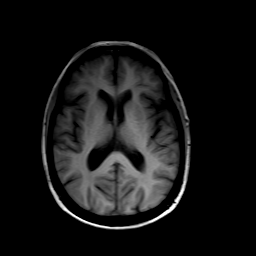

[Series 203: dadc map · axial · 4.0mm · 1.07mm/px · 1 of 30 slices shown (1 of 2)]
[im 1/30]
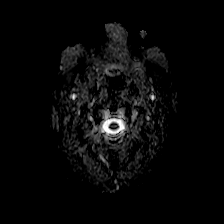

[Series 204: isob (id) · axial · 4.0mm · 1.07mm/px · 1 of 30 slices shown (1 of 2)]
[im 1/30]
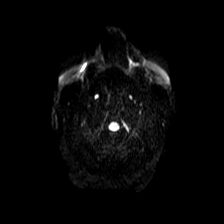

[Series 303: dadc map · coronal · 4.0mm · 0.83mm/px · 2 of 36 slices shown (2 of 2)]
[im 1/36]
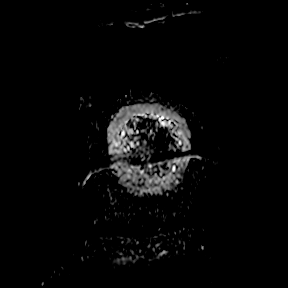
[im 36/36]
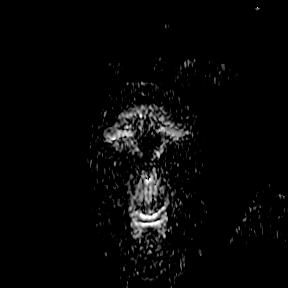

[Series 304: isob (id) · coronal · 4.0mm · 0.83mm/px · 2 of 36 slices shown (2 of 2)]
[im 1/36]
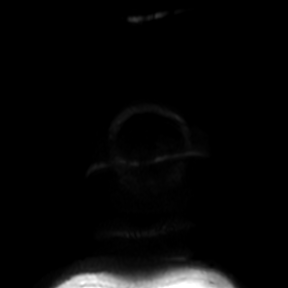
[im 36/36]
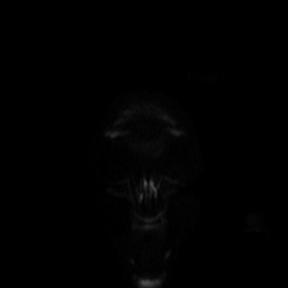

[Series 501: FLAIR fat-sat · axial · 5.0mm · 0.60mm/px · z∈[-71,+84]mm · 2 of 27 slices shown]
[im 1/27]
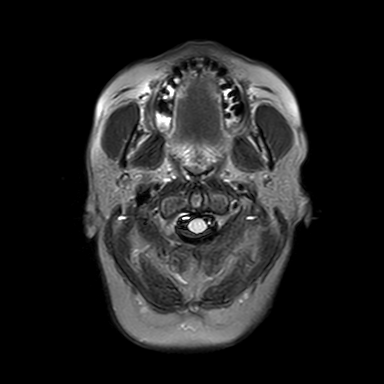
[im 27/27]
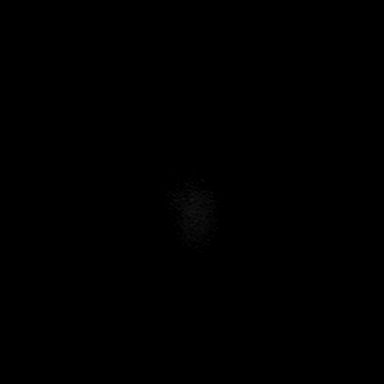

[Series 601: SWI · axial · 3.0mm · 0.53mm/px · z∈[-67,+80]mm · 6 of 100 slices shown (1 of 2)]
[im 1/100]
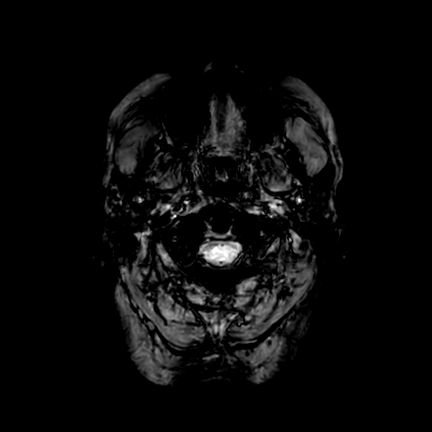
[im 20/100]
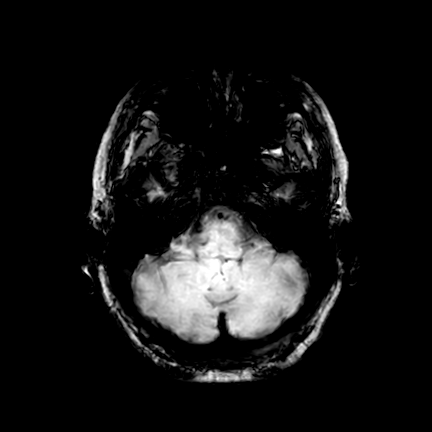
[im 40/100]
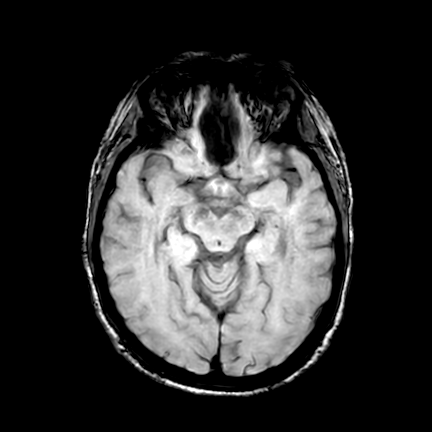
[im 60/100]
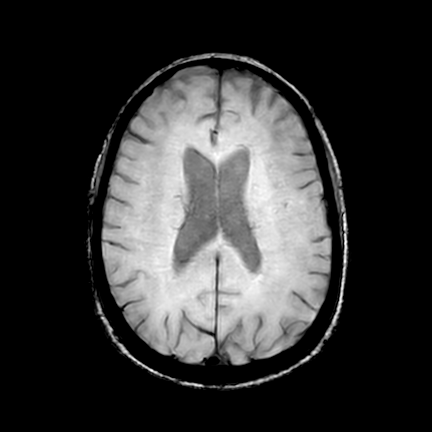
[im 80/100]
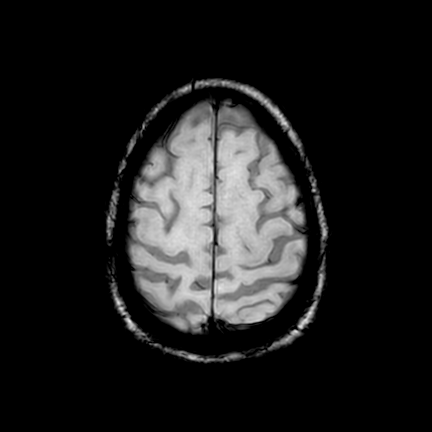
[im 100/100]
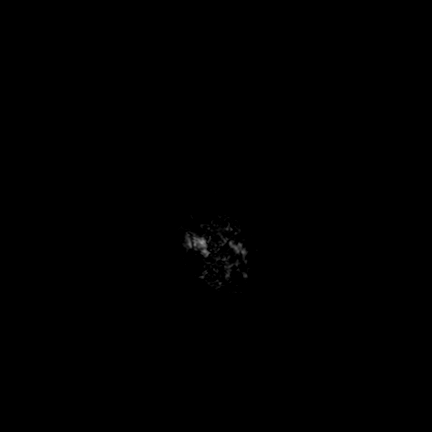

[Series 602: SWI · axial · 10.0mm · 0.53mm/px · z∈[-68,+85]mm · 5 of 78 slices shown (2 of 2)]
[im 1/78]
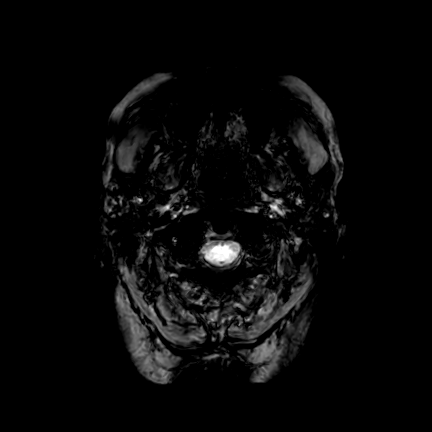
[im 20/78]
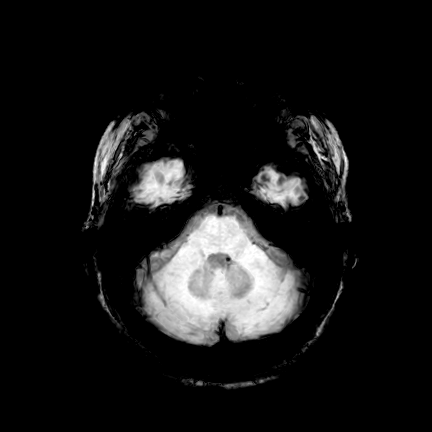
[im 39/78]
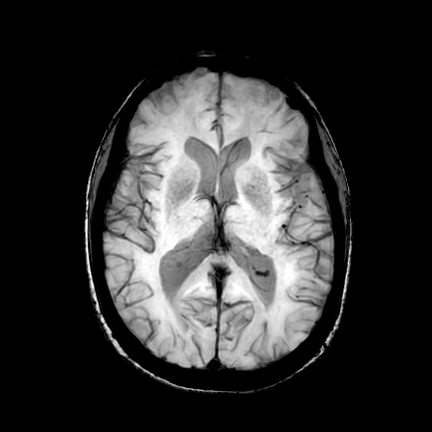
[im 58/78]
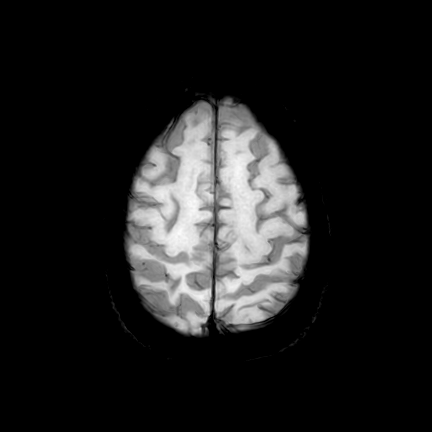
[im 78/78]
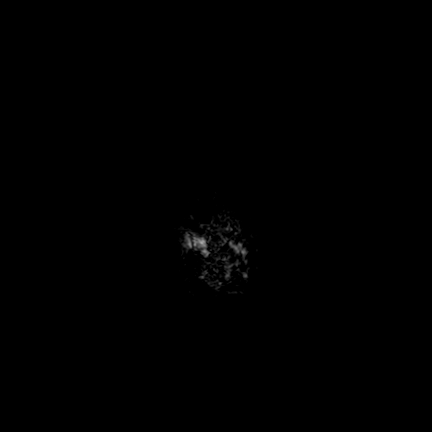

[Series 902: T1 post-contrast · axial · 1.5mm · 0.67mm/px · z∈[-66,+88]mm · 6 of 105 slices shown (1 of 2)]
[im 1/105]
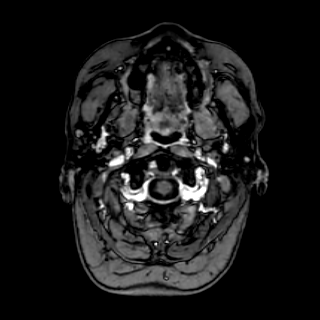
[im 21/105]
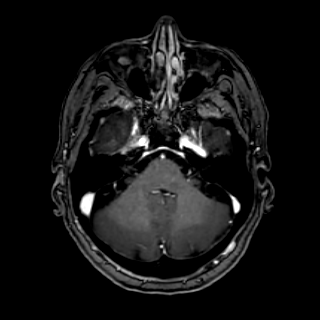
[im 42/105]
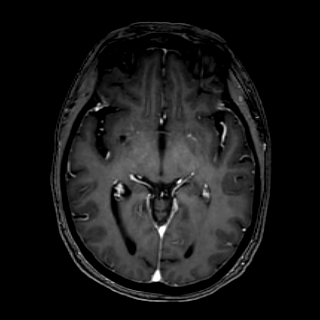
[im 63/105]
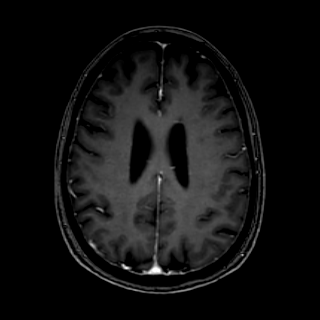
[im 84/105]
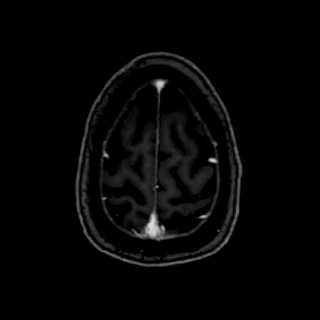
[im 105/105]
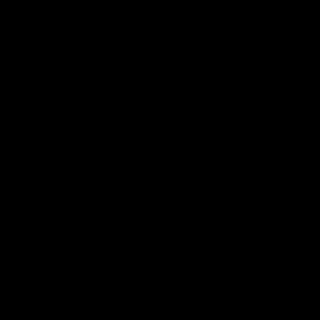

[Series 903: T1 post-contrast · coronal · 1.5mm · 0.58mm/px · 4 of 75 slices shown (2 of 2)]
[im 1/75]
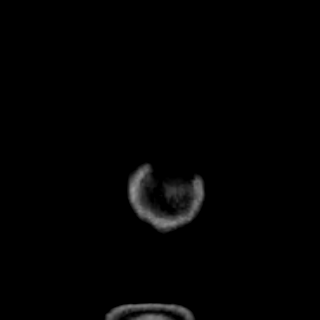
[im 25/75]
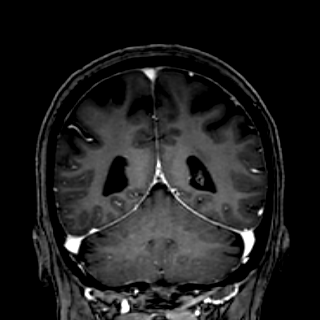
[im 50/75]
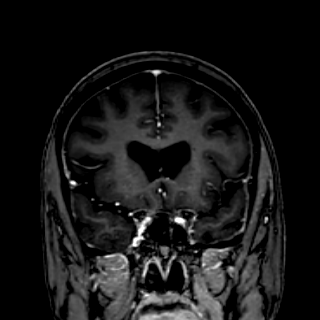
[im 75/75]
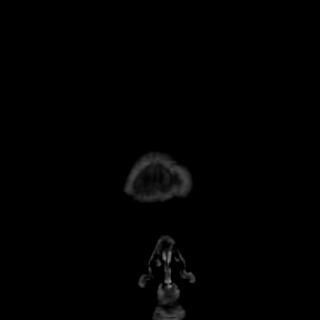

[Series 1001: T1 fat-sat post-contrast · axial · 5.0mm · 0.48mm/px · z∈[-74,+80]mm · 2 of 27 slices shown]
[im 1/27]
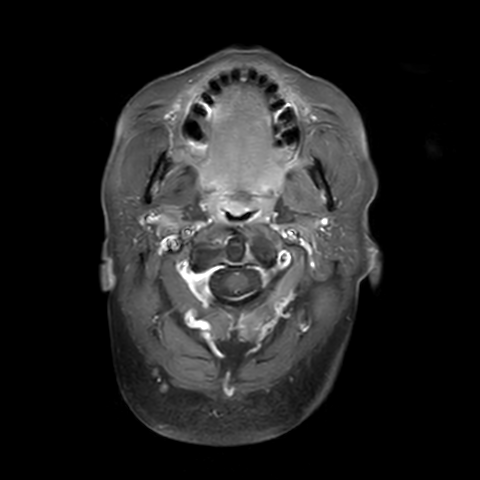
[im 27/27]
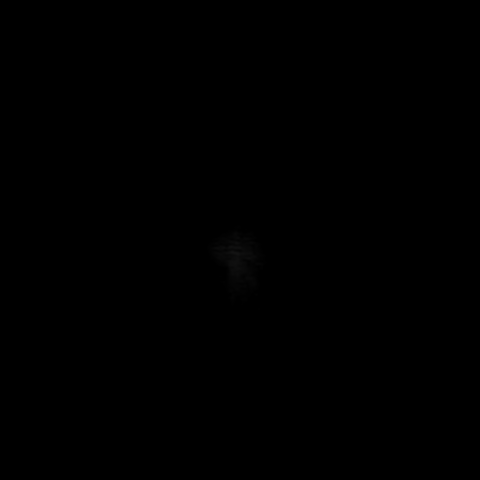

[32 of 48 positions shown; findings below may reference images not displayed]

FINDINGS: -------------------------------------------------------------------------------- 
------------------------- 
INTRACRANIAL: 
There is a 6 x 4 mm (axial dimension) by 2 mm craniocaudal dimension, 
homogeneous enhancing extra-axial mass overlying the right frontal convexity 
anteriorly, image 77 series 902 most consistent with a small meningioma in this 
location. No mass effect, brain parenchymal edema, or hemorrhage. Otherwise 
there is a mild degree of nonspecific periventricular and deep white matter T2 
FLAIR hyperintensity most likely reflective of chronic small vessel change. Tiny 
2 to 3 mm lacunar type infarct adjacent to the left lateral ventricle. Prominent 
perivascular space right lentiform nucleus. No other abnormal intracranial 
enhancement. No other evidence of mass lesion. No abnormal extra-axial fluid 
collection. 
No acute ischemia. No abnormal foci of susceptibility artifact in the brain. 
Patency of the intracranial vascular flow voids.  No acute intracranial 
hemorrhage, mass effect, midline shift. No large sellar mass. No hydrocephalus. 
Cerebral volume is age appropriate.  No other pathologic contrast enhancement.  
-------------------------------------------------------------------------------- 
----------------------- 
OTHER: 
ORBITS/SINUSES/T-BONES:  Visualized orbits show no acute abnormality or mass.  
Mastoid air cells and middle ear cavities are grossly clear.  Visualized 
paranasal sinuses are clear. 
MARROW SIGNAL/SOFT TISSUES: No focal suspect signal abnormality. There appears 
to be at least mild canal stenosis C3-C4 level. There is scalp soft tissue loss 
at the right frontal vertex anteriorly, image 85 series 902. This could reflect 
scar. Additionally, there appears to be additional soft tissue scar right 
supraorbital region. 
-------------------------------------------------------------------------------- 
-------------------
IMPRESSION: No acute intracranial process. 
Subcentimeter extra-axial homogeneously enhancing mass right frontal convexity 
anteriorly consistent with small meningioma. No mass effect, parenchymal edema, 
or hemorrhage. 
Other chronic appearing parenchymal changes detailed above. Perhaps suspected 
scalp scarring right frontal vertex anteriorly and in the right supraorbital 
region. 
There appears to be at least mild canal stenosis C3-C4 level. Dedicated MRI 
cervical spine could better assess.

## 2022-07-08 IMAGING — DX KNEE 3 VIEWS LEFT
3 series · 3 of 3 positions shown · non-contrast
Comparison: None.

________________________________________________________________________________________________ 
KNEE 3 VIEWS LEFT, 07/08/2022 [DATE]: 
CLINICAL INDICATION: Pain in left knee.

[PA]
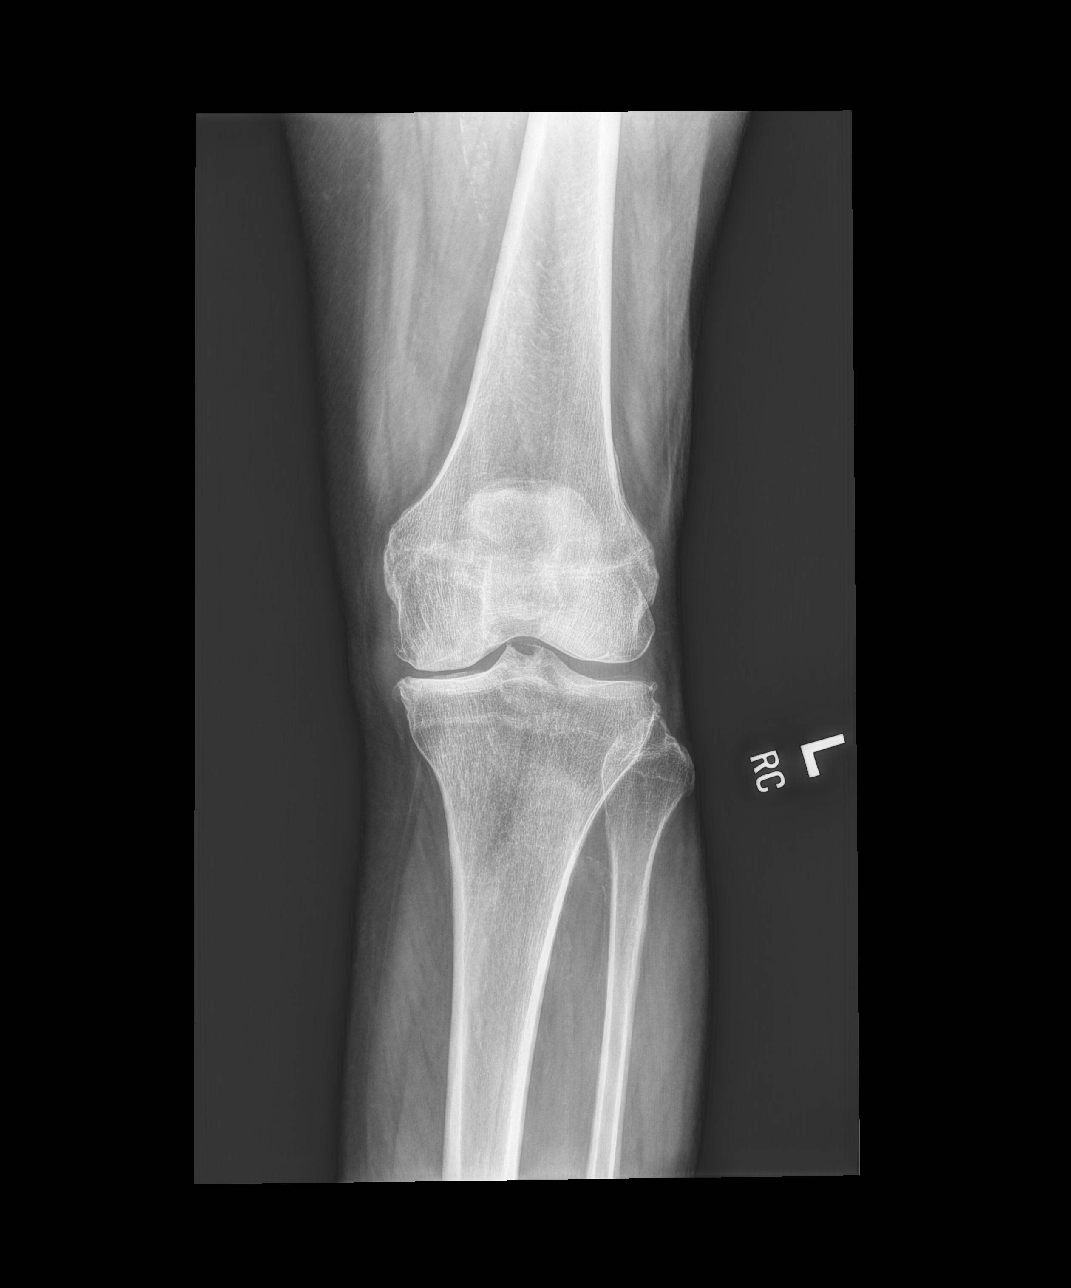

[oblique]
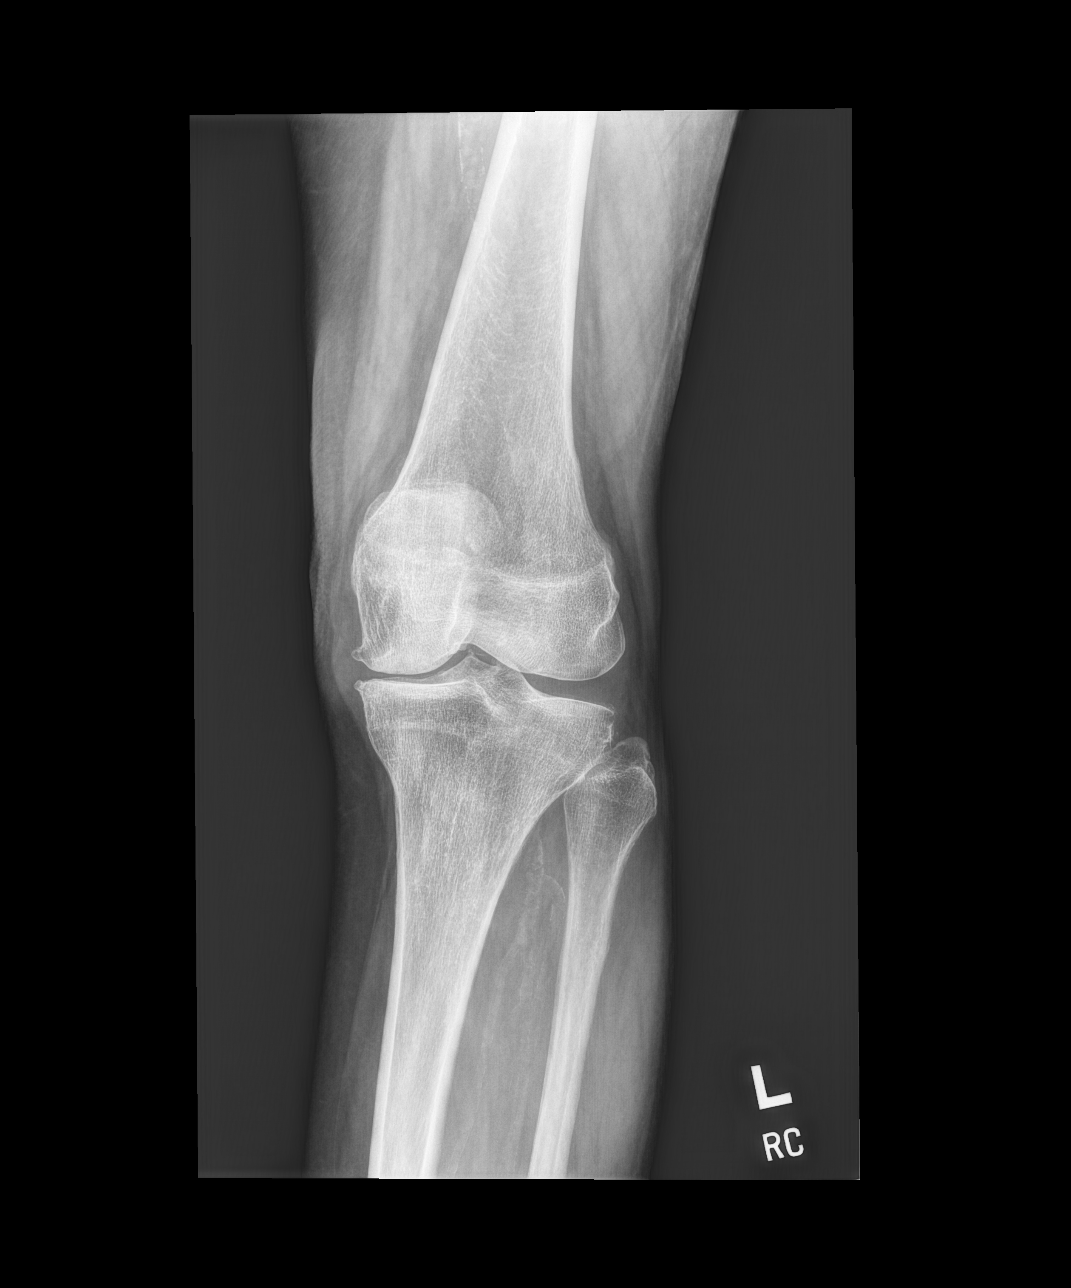

[lateral]
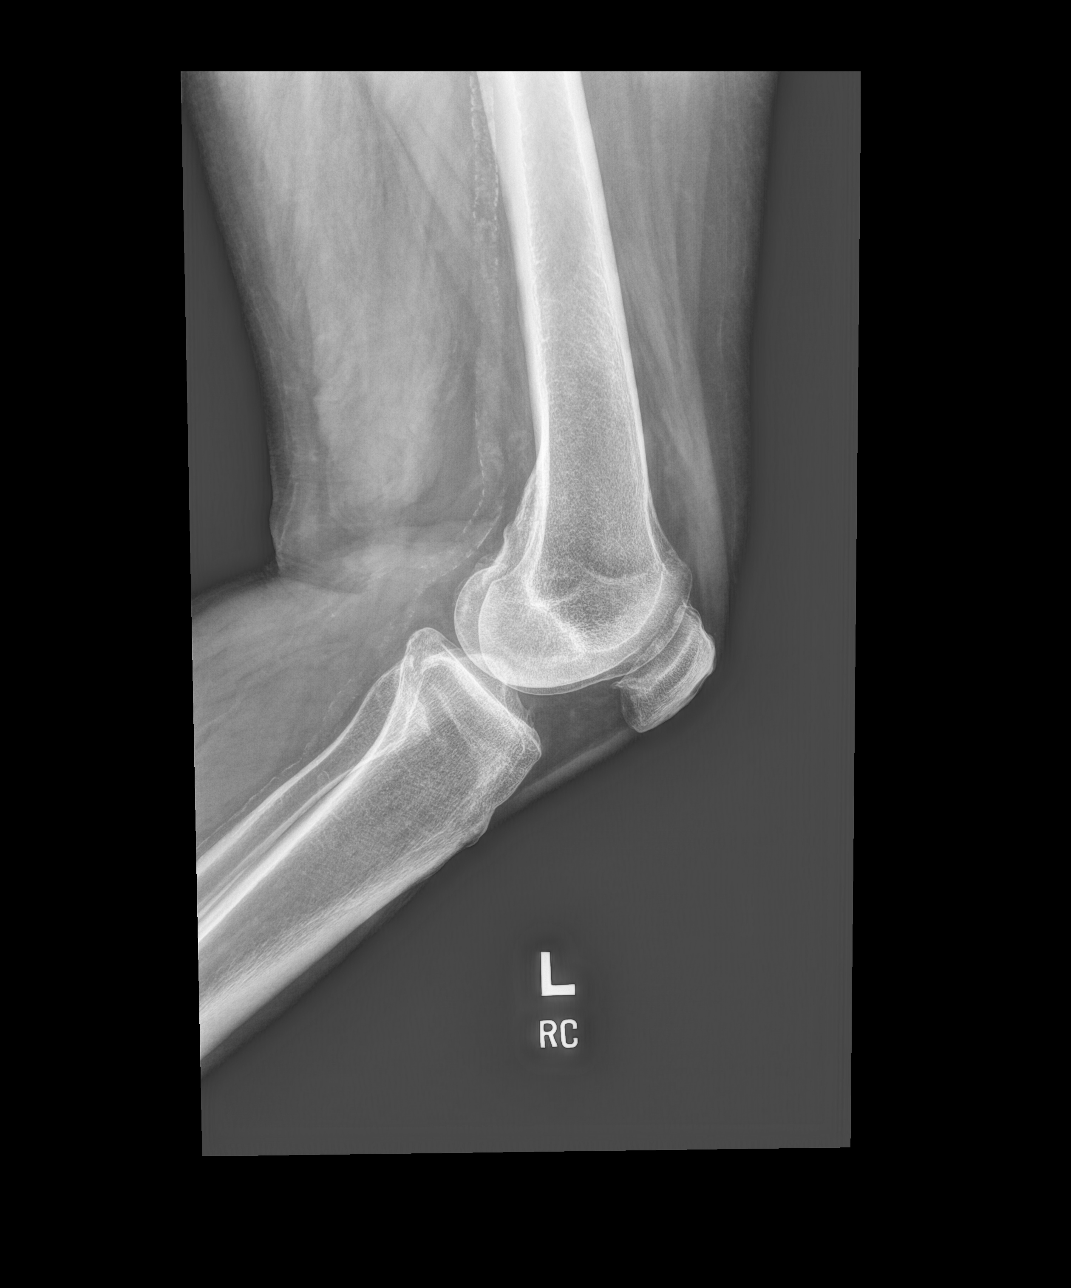

[3 of 3 positions shown; findings below may reference images not displayed]

FINDINGS: No fracture. Normal alignment. Moderate medial compartment joint 
space narrowing. No effusion. Osteopenia. Tricompartmental osteophytes. 
Atherosclerosis.
IMPRESSION: Moderate degenerative change and osteopenia: DXA with TBS (trabecular bone 
score) may be helpful for further evaluation.

## 2022-12-28 IMAGING — MR MRI BRAIN W/WO CONTRAST
12 of 16 series · 32 of 48 positions shown · IV contrast (gadavist)
Comparison: MRI brain from May 23, 2022.

________________________________________________________________________________________________ 
MRI BRAIN W/WO CONTRAST, 12/28/2022 [DATE]: 
CLINICAL INDICATION: History of fall. Right frontal convexity meningioma.
TECHNIQUE: Multiplanar, multiecho position MR images of the brain were performed 
without and with 6.5 mL of Gadavist were injected intravenously by hand. 1 mL of 
Gadavist discarded. Patient was scanned on a 3T magnet.

[Series 101: survey · axial · 10.0mm · 0.98mm/px · 1 of 5 slices shown]
[im 1/5]
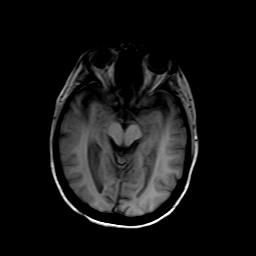

[Series 203: dadc map · axial · 4.0mm · 1.07mm/px · 1 of 30 slices shown (1 of 2)]
[im 1/30]
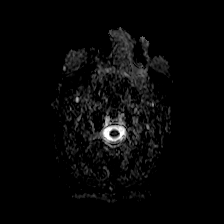

[Series 204: isob (id) · axial · 4.0mm · 1.07mm/px · 1 of 30 slices shown (1 of 2)]
[im 1/30]
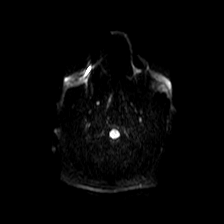

[Series 303: dadc map · coronal · 4.0mm · 0.81mm/px · 1 of 36 slices shown (2 of 2)]
[im 1/36]
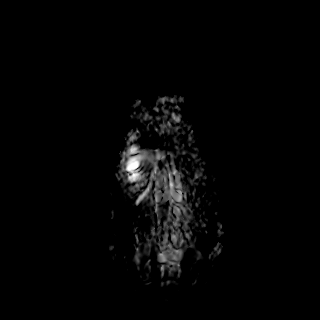

[Series 304: isob (id) · coronal · 4.0mm · 0.81mm/px · 2 of 36 slices shown (2 of 2)]
[im 1/36]
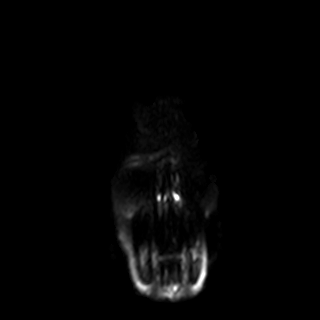
[im 36/36]
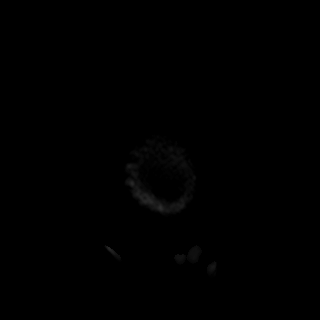

[Series 401: t1_se_sag · sagittal · 4.0mm · 0.42mm/px · 2 of 27 slices shown]
[im 1/27]
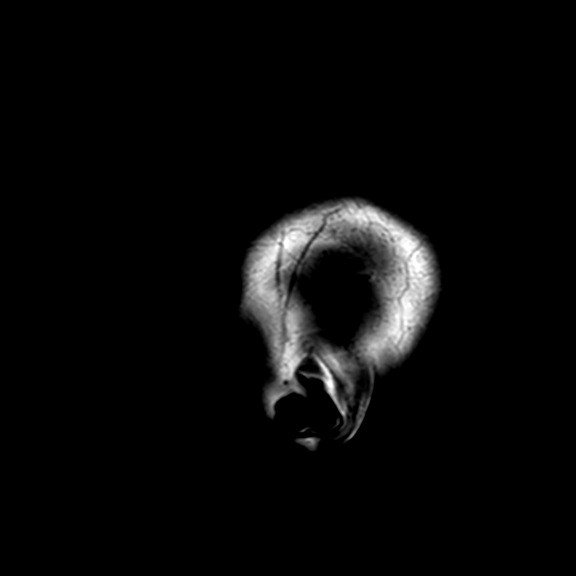
[im 27/27]
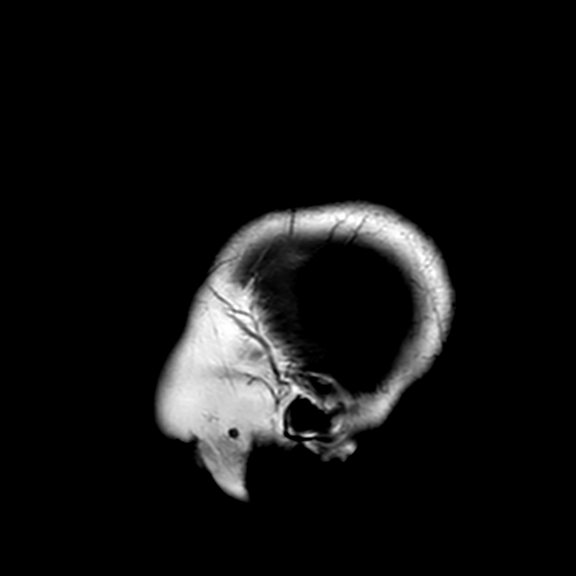

[Series 501: FLAIR fat-sat · axial · 5.0mm · 0.60mm/px · z∈[-83,+78]mm · 2 of 28 slices shown]
[im 1/28]
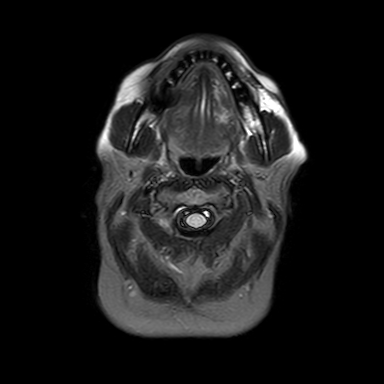
[im 28/28]
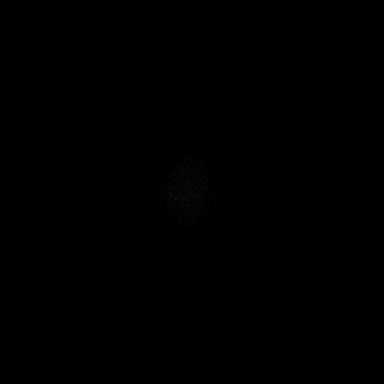

[Series 601: SWI · axial · 3.0mm · 0.53mm/px · z∈[-70,+78]mm · 6 of 100 slices shown (1 of 2)]
[im 1/100]
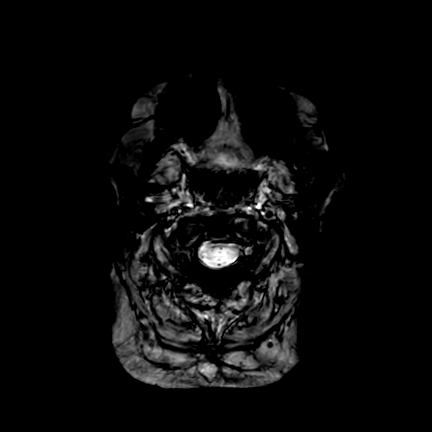
[im 20/100]
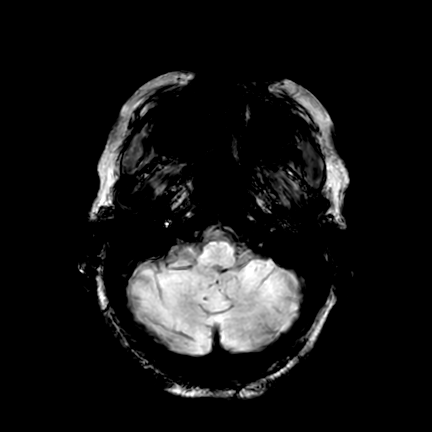
[im 40/100]
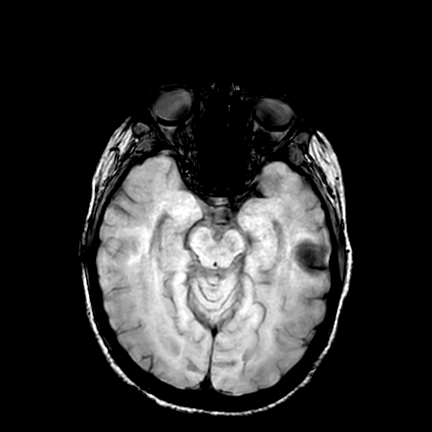
[im 60/100]
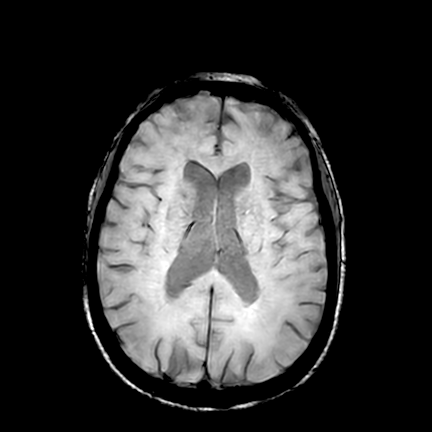
[im 80/100]
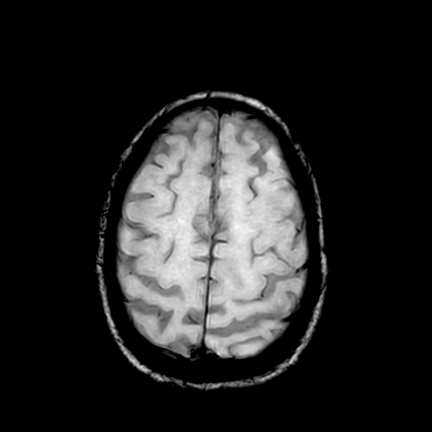
[im 100/100]
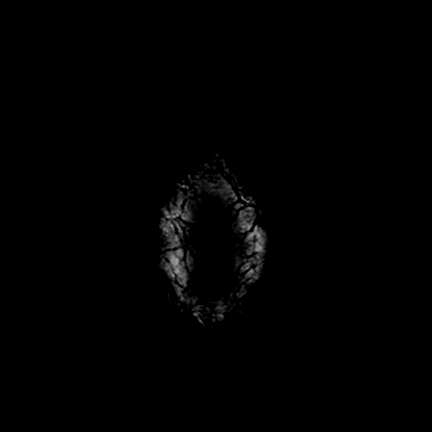

[Series 602: SWI · axial · 10.0mm · 0.53mm/px · z∈[-71,+83]mm · 4 of 78 slices shown (2 of 2)]
[im 1/78]
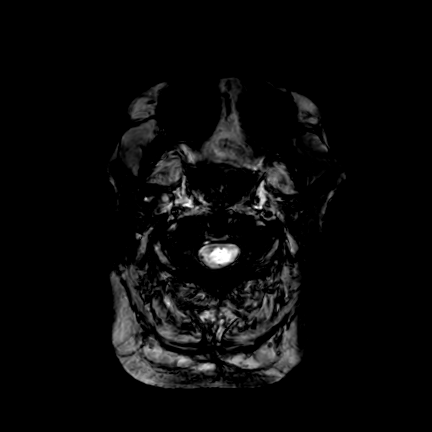
[im 26/78]
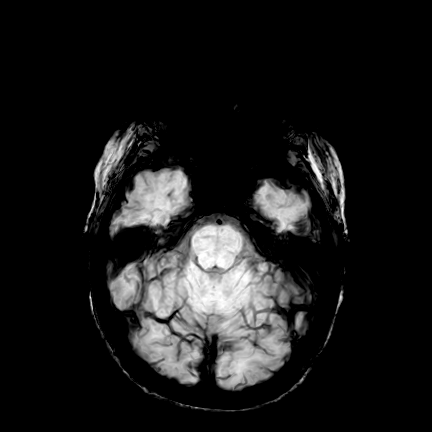
[im 52/78]
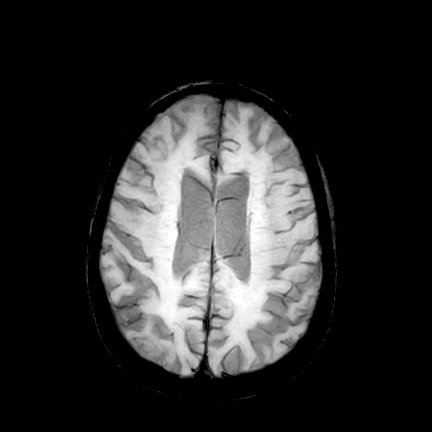
[im 78/78]
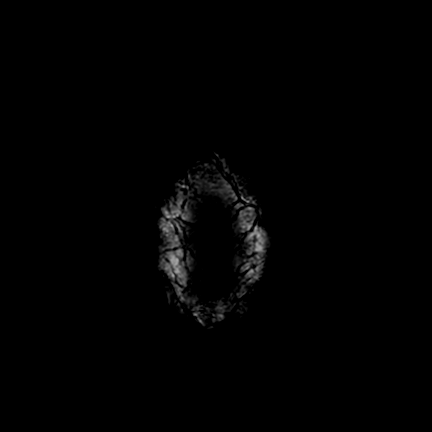

[Series 1002: T1 post-contrast · axial · 1.5mm · 0.67mm/px · z∈[-70,+84]mm · 6 of 104 slices shown (1 of 2)]
[im 1/104]
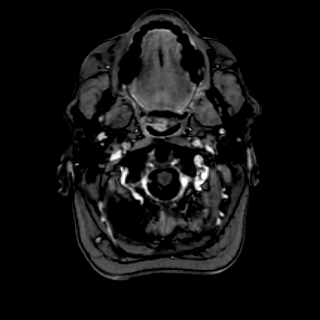
[im 21/104]
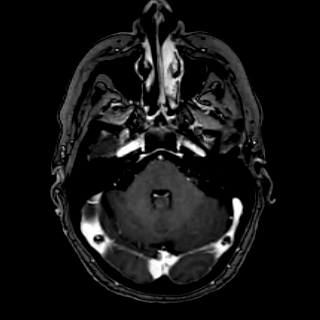
[im 42/104]
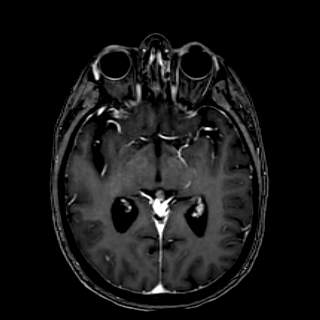
[im 62/104]
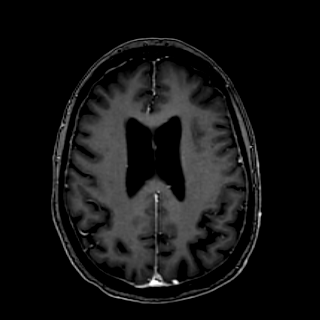
[im 83/104]
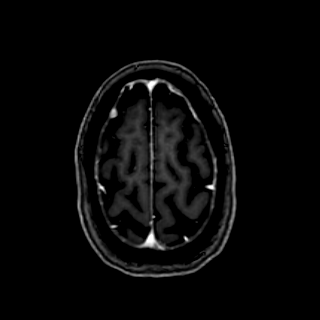
[im 104/104]
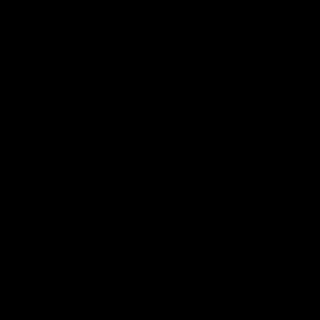

[Series 1003: T1 post-contrast · coronal · 1.5mm · 0.58mm/px · 4 of 75 slices shown (2 of 2)]
[im 1/75]
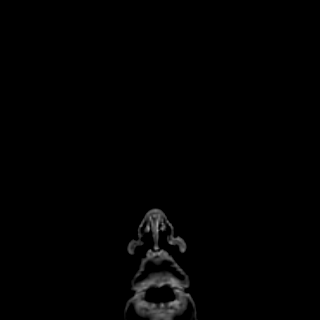
[im 25/75]
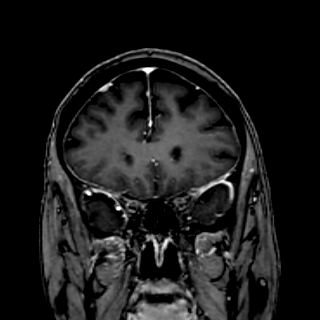
[im 50/75]
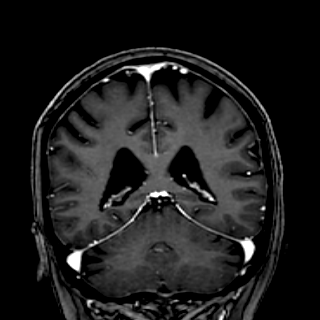
[im 75/75]
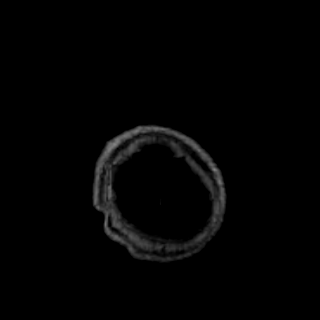

[Series 1101: T1 fat-sat post-contrast · axial · 5.0mm · 0.48mm/px · z∈[-83,+79]mm · 2 of 28 slices shown]
[im 1/28]
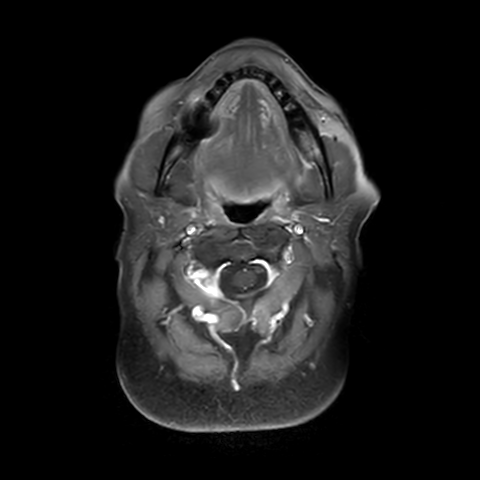
[im 28/28]
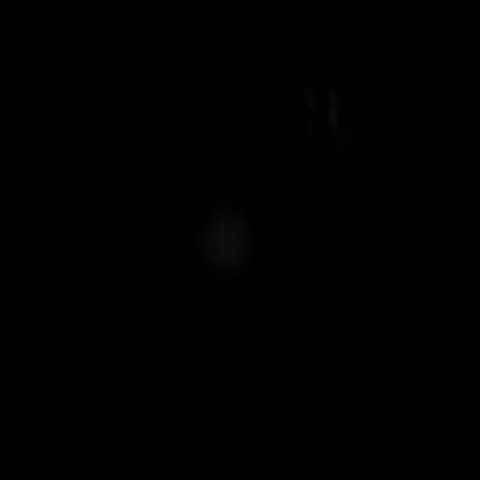

[32 of 48 positions shown; findings below may reference images not displayed]

FINDINGS: -------------------------------------------------------------------------------- 
------------------------- 
INTRACRANIAL: 
No acute ischemia. No abnormal foci of susceptibility artifact in the brain.  
Patency of intracranial vascular flow voids. Periventricular and deep white 
matter change, probably secondary to microangiopathy. This is mild. Chronic left 
caudate body lacunar infarct, chronic and stable. No acute hemorrhage, midline 
shift, mass effect.  No pathologic enhancement in the brain parenchyma. Stable 
right frontal convexity extra-axial mass consistent with meningioma. On coronal 
postcontrast reconstruction this measures 0.8 x 0.4 cm (maximum coronal 
dimension by orthogonal). On axial postcontrast reconstruction this measures
x 0.4 cm (maximum axial dimension by orthogonal). 
-------------------------------------------------------------------------------- 
----------------------- 
OTHER: 
ORBITS/SINUSES/T-BONES:  Status post bilateral cataract extraction.  Mastoid air 
cells and middle ear cavities are grossly clear.  Visualized paranasal sinuses 
are clear. 
MARROW SIGNAL/SOFT TISSUES: Right frontal scalp focal area of scarring. 
-------------------------------------------------------------------------------- 
-------------------
IMPRESSION: No acute intracranial abnormality. 
Stable mild white matter microangiopathy change. 
Stable chronic lacunar infarct in the left caudate body. 
Stable right frontal convexity meningioma. Advise follow-up MRI in 12 months to 
ensure continued stability.

## 2023-01-23 IMAGING — MG MAMMOGRAPHY SCREENING BILATERAL 3[PERSON_NAME]
8 series · 9 of 24 positions shown · non-contrast
Comparison: Comparison was made to prior examinations.

________________________________________________________________________________________________ 
MAMMOGRAPHY SCREENING BILATERAL 3DAID OZIL, 01/23/2023 [DATE]: 
CLINICAL INDICATION: Encounter for screening mammogram.
TECHNIQUE: Digital bilateral mammograms and 3-D Tomosynthesis were obtained. 
These were interpreted both primarily and with the aid of computer-aided 
detection system.  
BREAST DENSITY: (Level C) The breasts are heterogeneously dense, which may 
obscure small masses.

[R MLO]
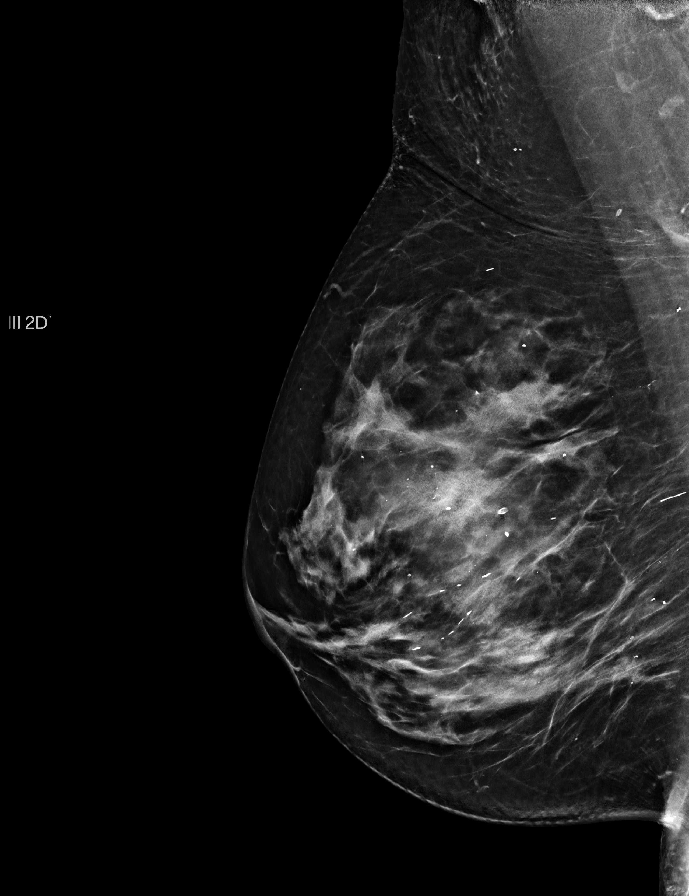

[L MLO]
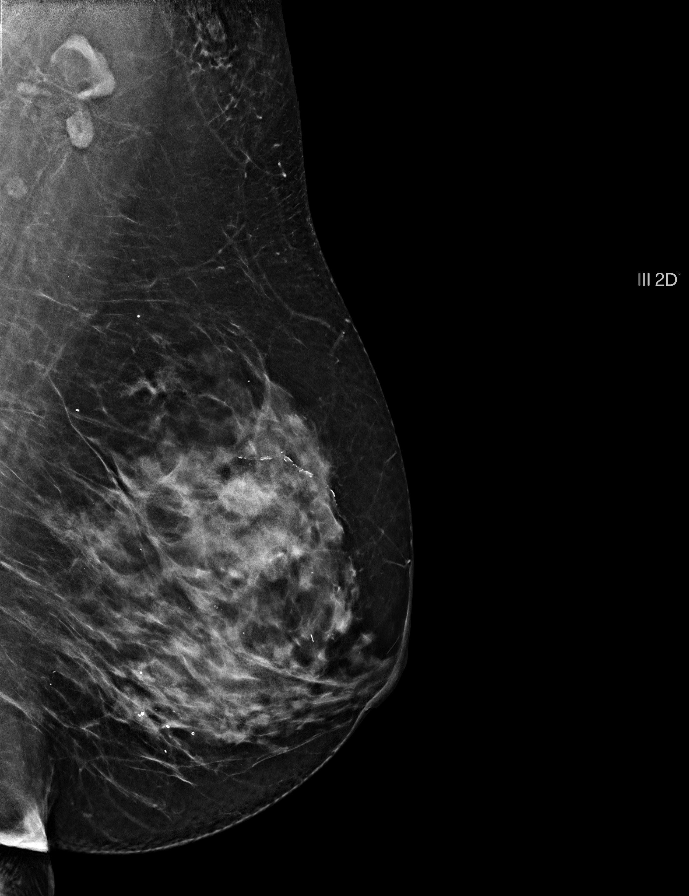

[L CC]
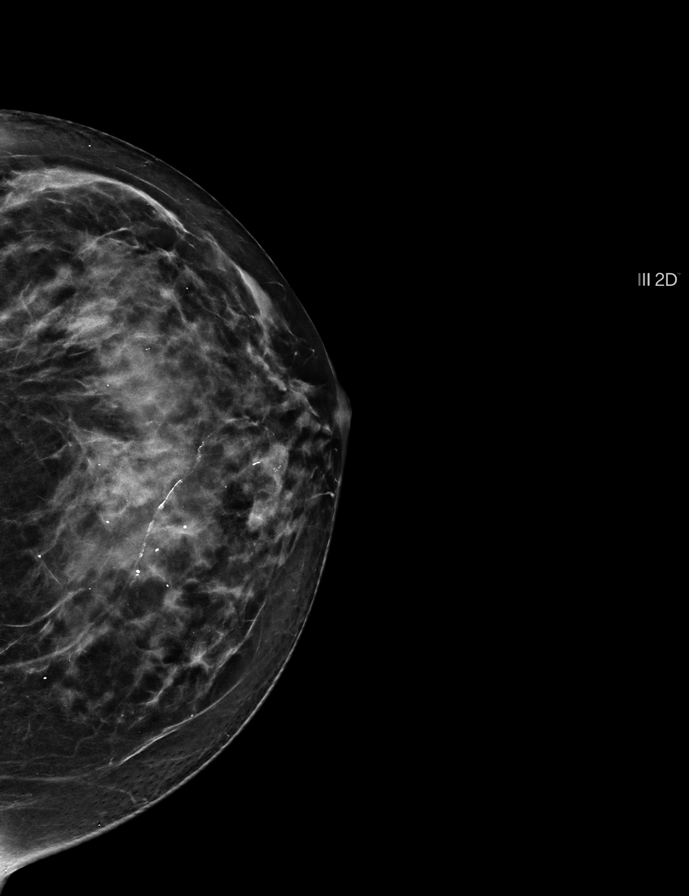

[R CC]
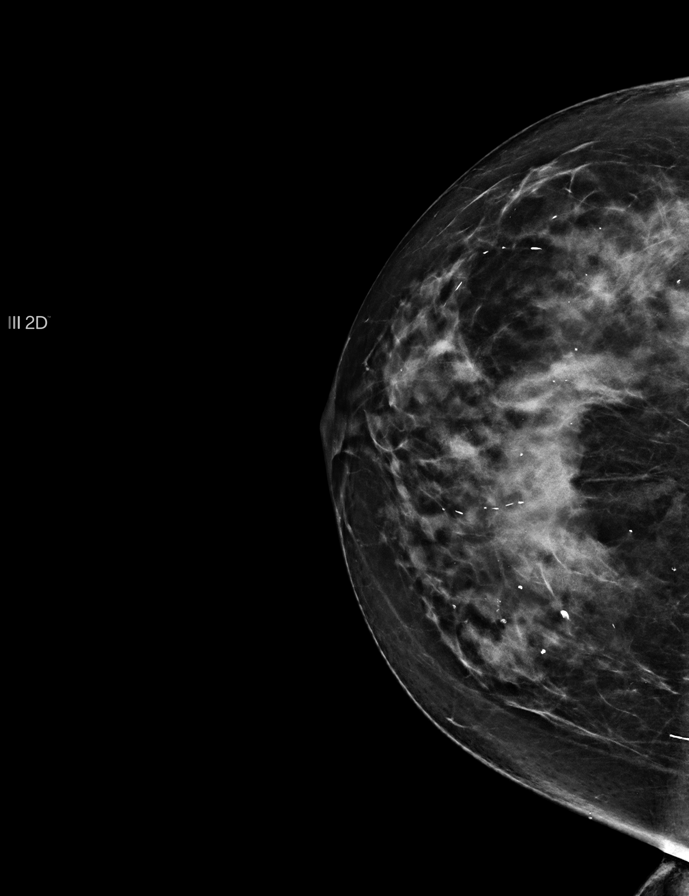

[R CC tomo · 2 of 20 frames shown]
[frame 7/20]
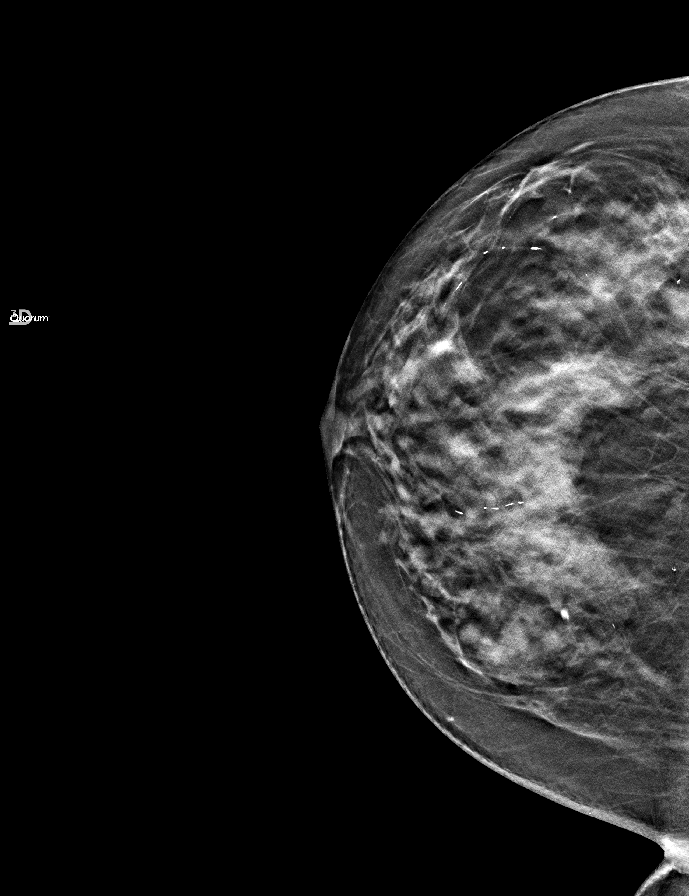
[frame 11/20]
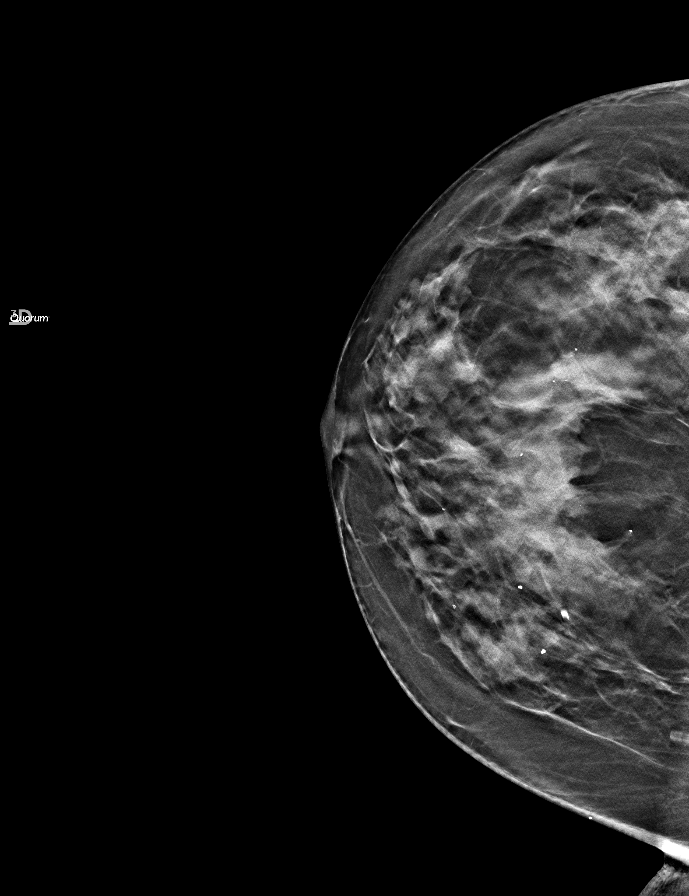

[R MLO tomo · tomo slice 13/24.0]
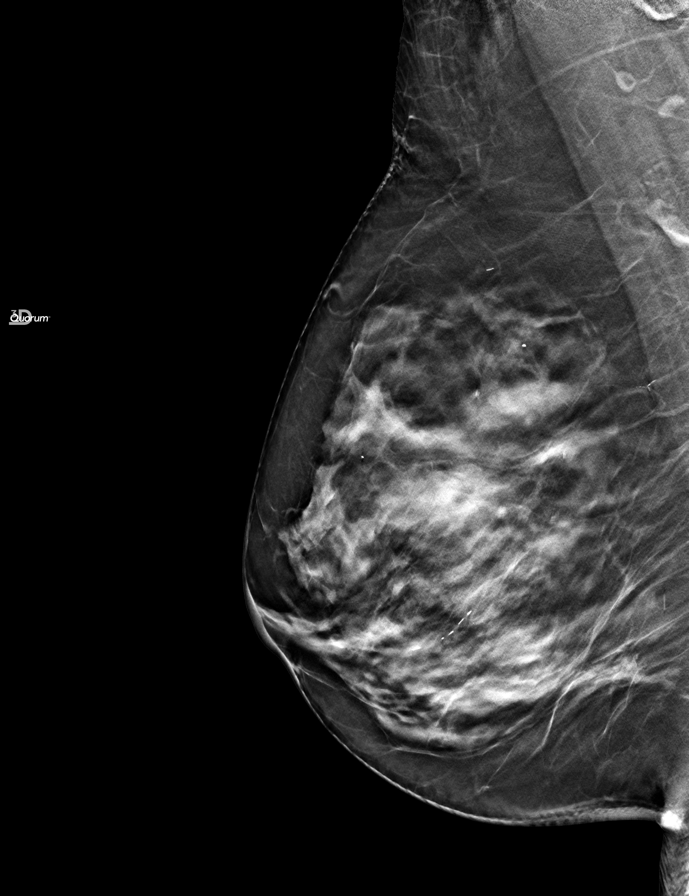

[L MLO tomo · tomo slice 13/24.0]
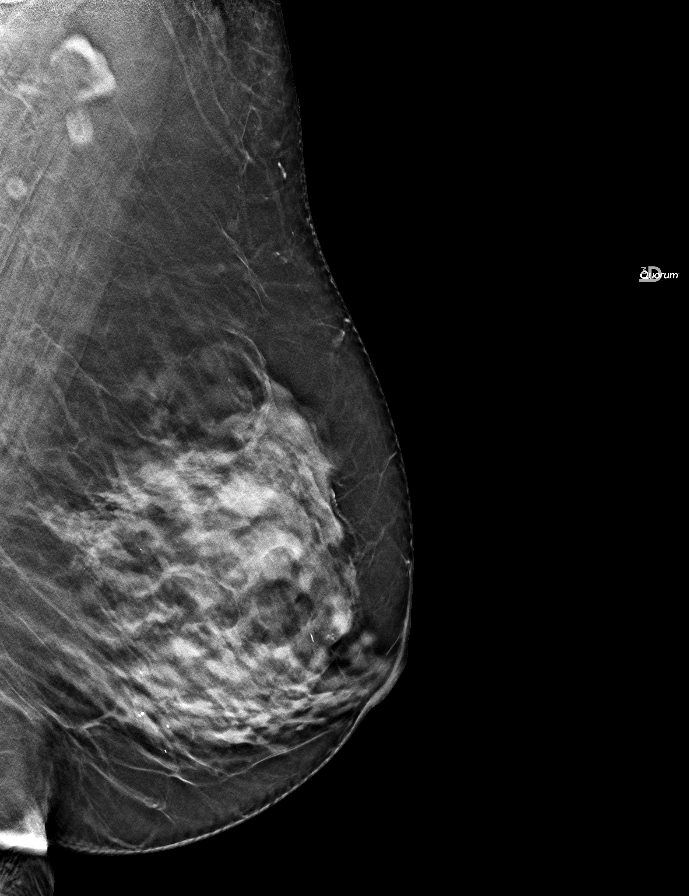

[L CC tomo · tomo slice 11/20.0]
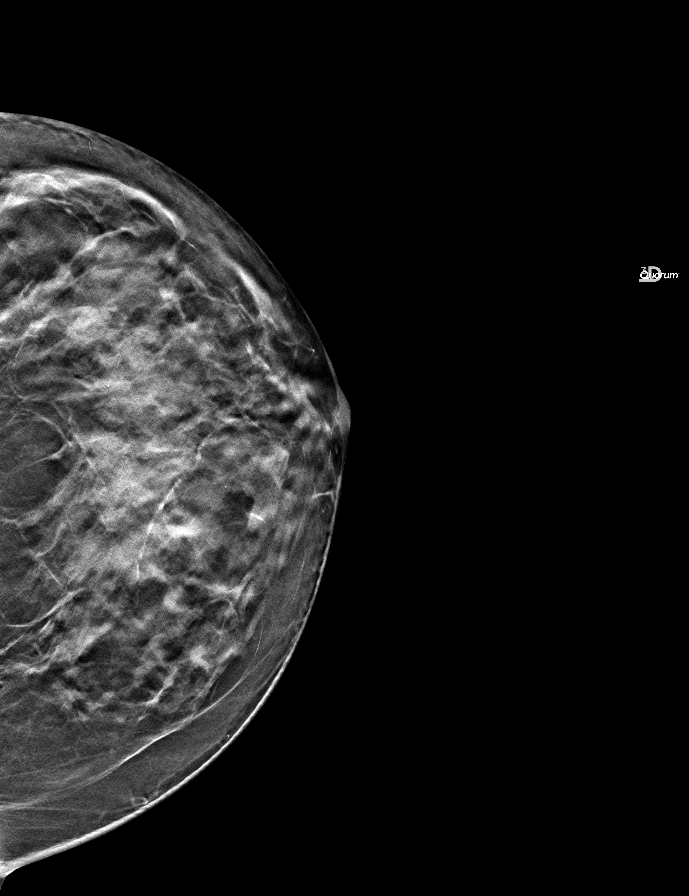

[9 of 24 positions shown; findings below may reference images not displayed]

FINDINGS: No suspicious mass, calcifications, or area of architectural 
distortion in either breast.
IMPRESSION: Stable exam. 
(BI-RADS 2) Benign findings. Routine mammographic follow-up is recommended.

## 2023-03-21 IMAGING — CT CT CALCIUM SCORING
1 series · 15 of 20 positions shown, 19 images · non-contrast
Comparison: There are no previous exams available for comparison.

________________________________________________________________________________________________ 
CT CALCIUM SCORING, 03/21/2023 [DATE]:
INDICATION: Hyperlipidemia, unspecified

[Series 2: cascoreseq 3.0 b35s 60% · axial · 0.35mm/px · z∈[-256,-135]mm · 15 of 91 slices shown, 19 images]
[im 5/91  vessel]
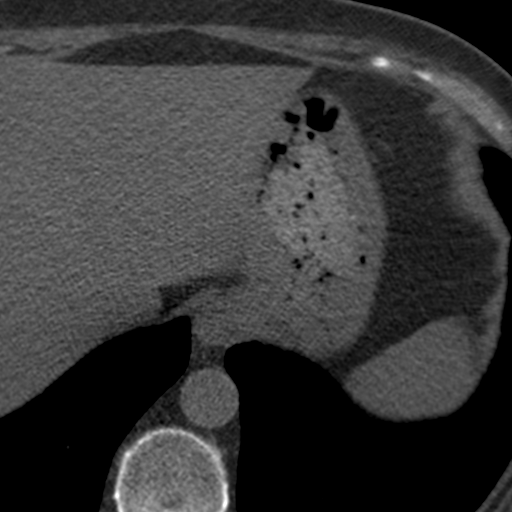
[im 5/91  lung]
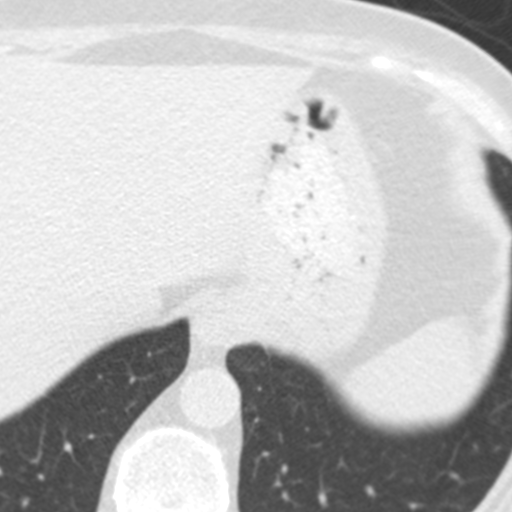
[im 10/91  vessel]
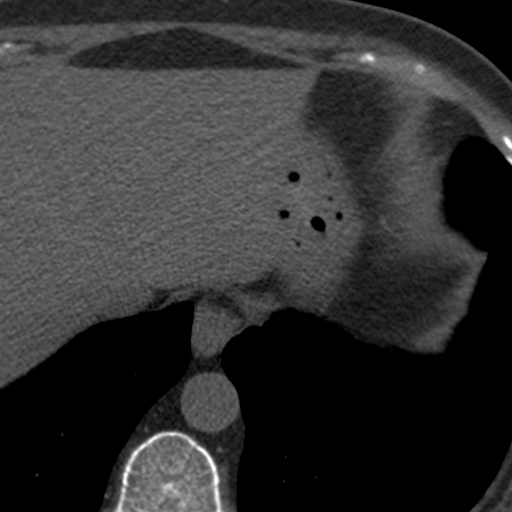
[im 19/91  vessel]
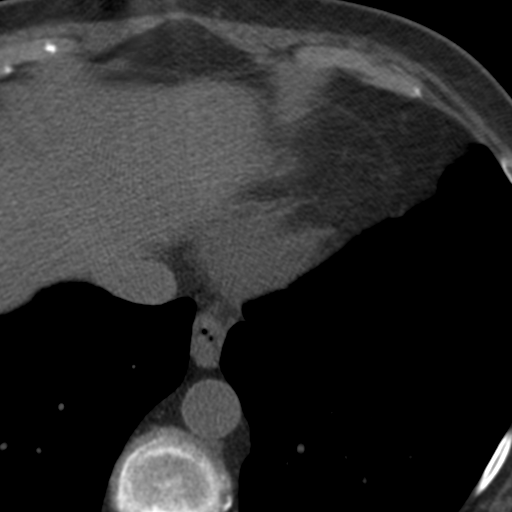
[im 24/91  vessel]
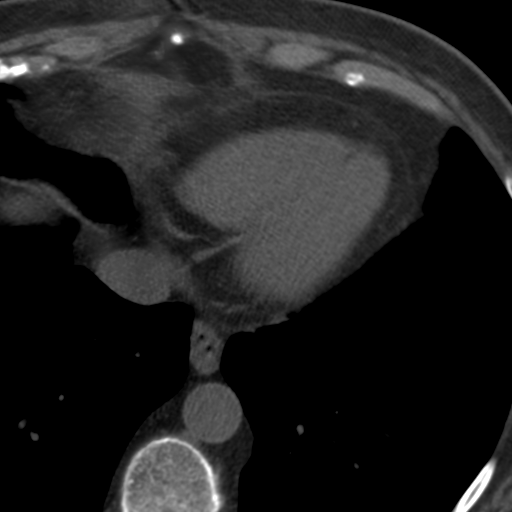
[im 29/91  vessel]
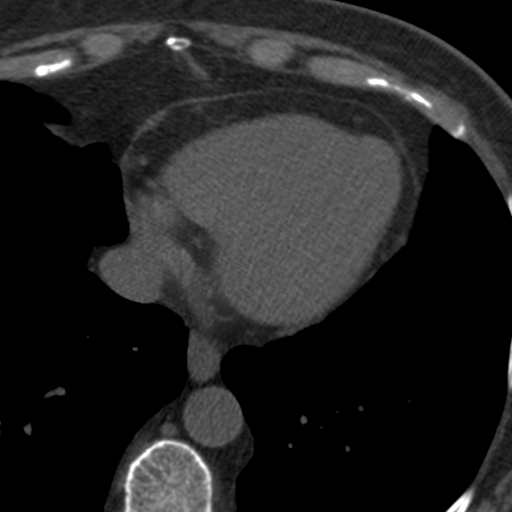
[im 29/91  lung]
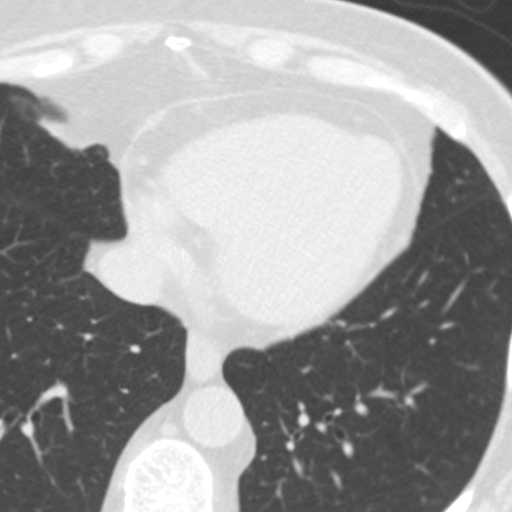
[im 34/91  vessel]
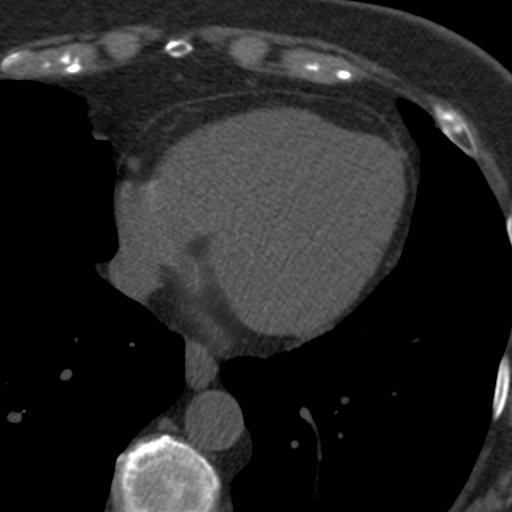
[im 38/91  vessel]
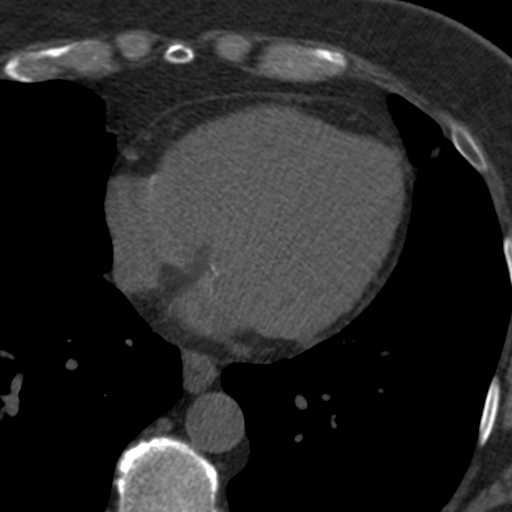
[im 48/91  vessel]
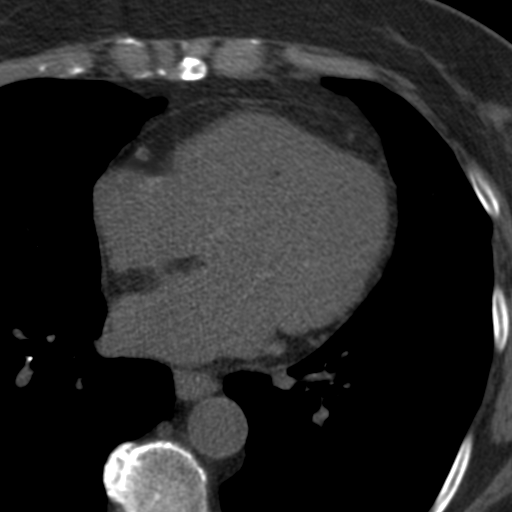
[im 53/91  vessel]
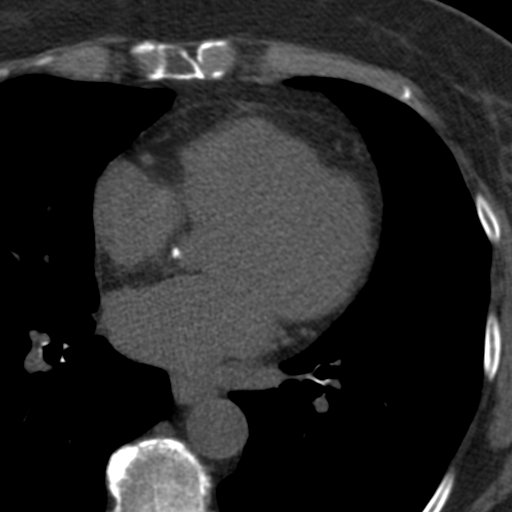
[im 53/91  lung]
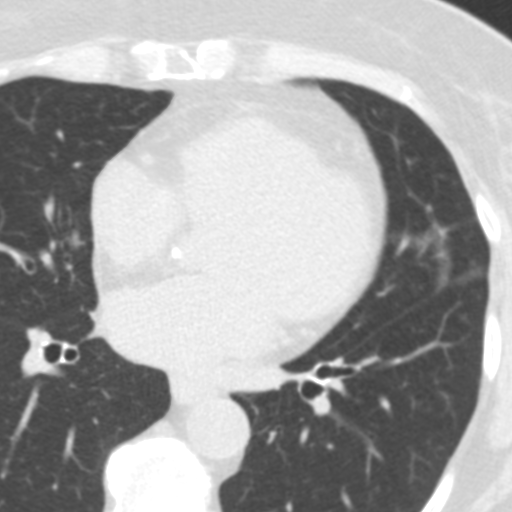
[im 57/91  vessel]
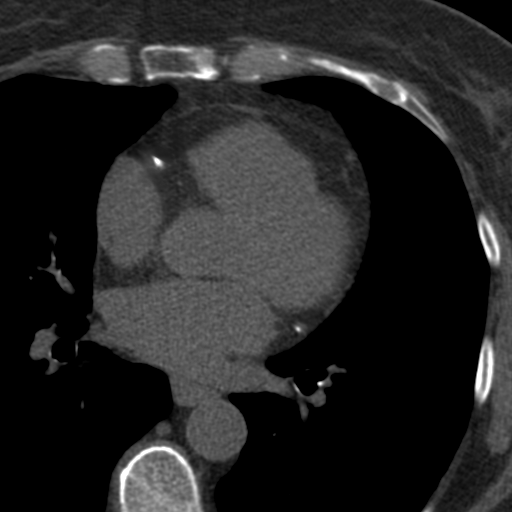
[im 62/91  vessel]
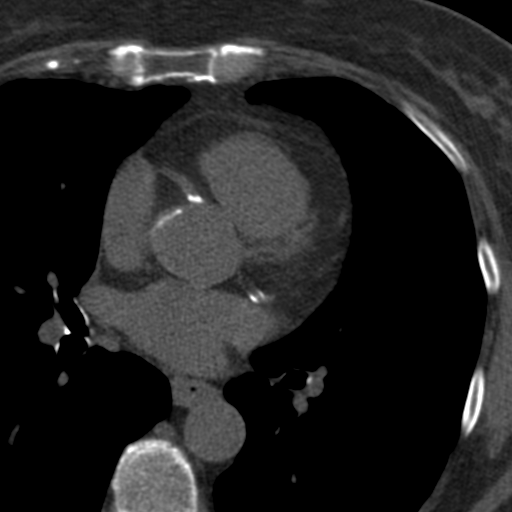
[im 67/91  vessel]
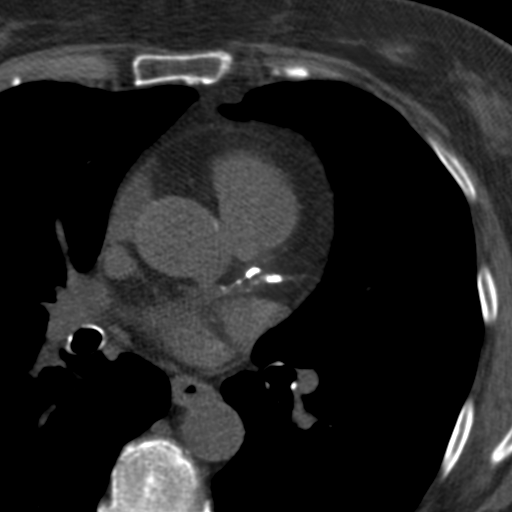
[im 76/91  vessel]
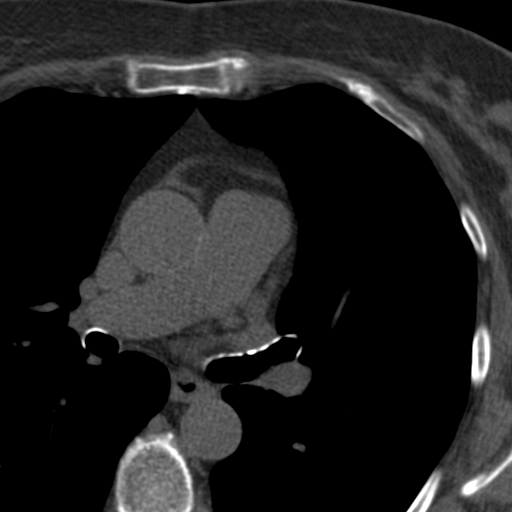
[im 76/91  lung]
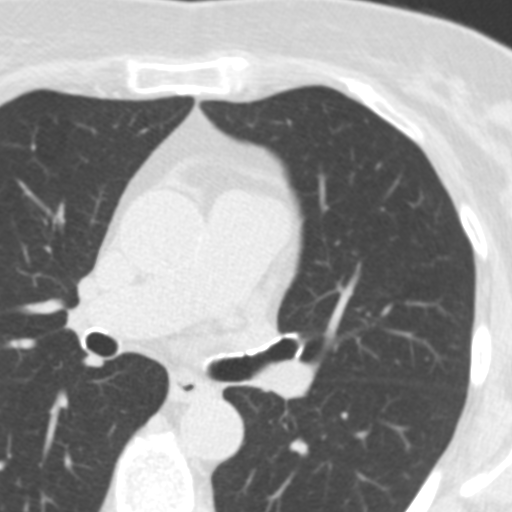
[im 81/91  vessel]
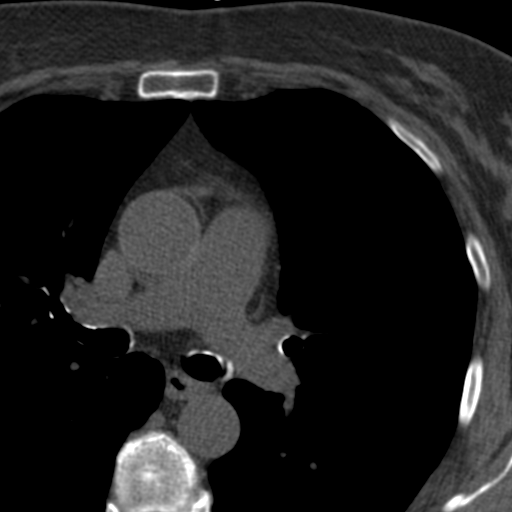
[im 86/91  vessel]
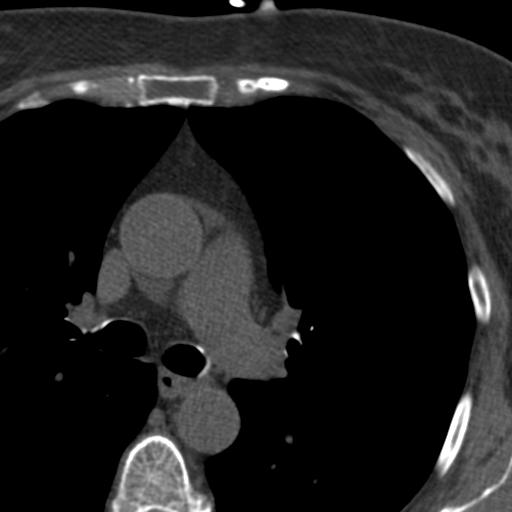

[15 of 20 positions shown; findings below may reference images not displayed]

Count of known CT and Cardiac Nuclear Medicine studies performed in the previous 
12 months = 1
FINDINGS: Visualized lungs are clear.
IMPRESSION: Left Main (LM) Score: 2 
Left Anterior Descending Artery (LAD) Score: 81 
Circumflex (CM) Score: 150 
Right Coronary Artery (RCA) Score: 168 
Posterior Descending Artery (PDA) Score: 0 
Other: 106
IMPRESSION: Total Calcium Score: 507 
No ancillary findings. 
If calcium score is greater than 100, and the patient is symptomatic with chest 
pain or shortness of breath, would recommend follow-up with coronary CTA with 
Cleerly plaque analysis. 
Calcium scoring sheets to follow. 
RADIATION DOSE REDUCTION: All CT scans are performed using radiation dose 
reduction techniques, when applicable.  Technical factors are evaluated and 
adjusted to ensure appropriate moderation of exposure.  Automated dose 
management technology is applied to adjust the radiation doses to minimize 
exposure while achieving diagnostic quality images. 
Consider further evaluation if multi-vessel or left-main predominant disease is 
present. 
Calcium score                                     Presence of Plaque 
0                                                    No evidence of plaque 
1-10                                               Minimal evidence of plaque 
11-100                                            Mild evidence of plaque 
101-400                                          Moderate evidence of plaque 
Over 400                                        Extensive evidence of plaque

## 2023-04-29 IMAGING — DX CHEST PA AND LATERAL
2 series · 2 of 2 positions shown · non-contrast
Comparison: 01/23/2022 CT chest

________________________________________________________________________________________________ 
CLINICAL INDICATION: Pneumonia, Unspecified Organism.

[PA]
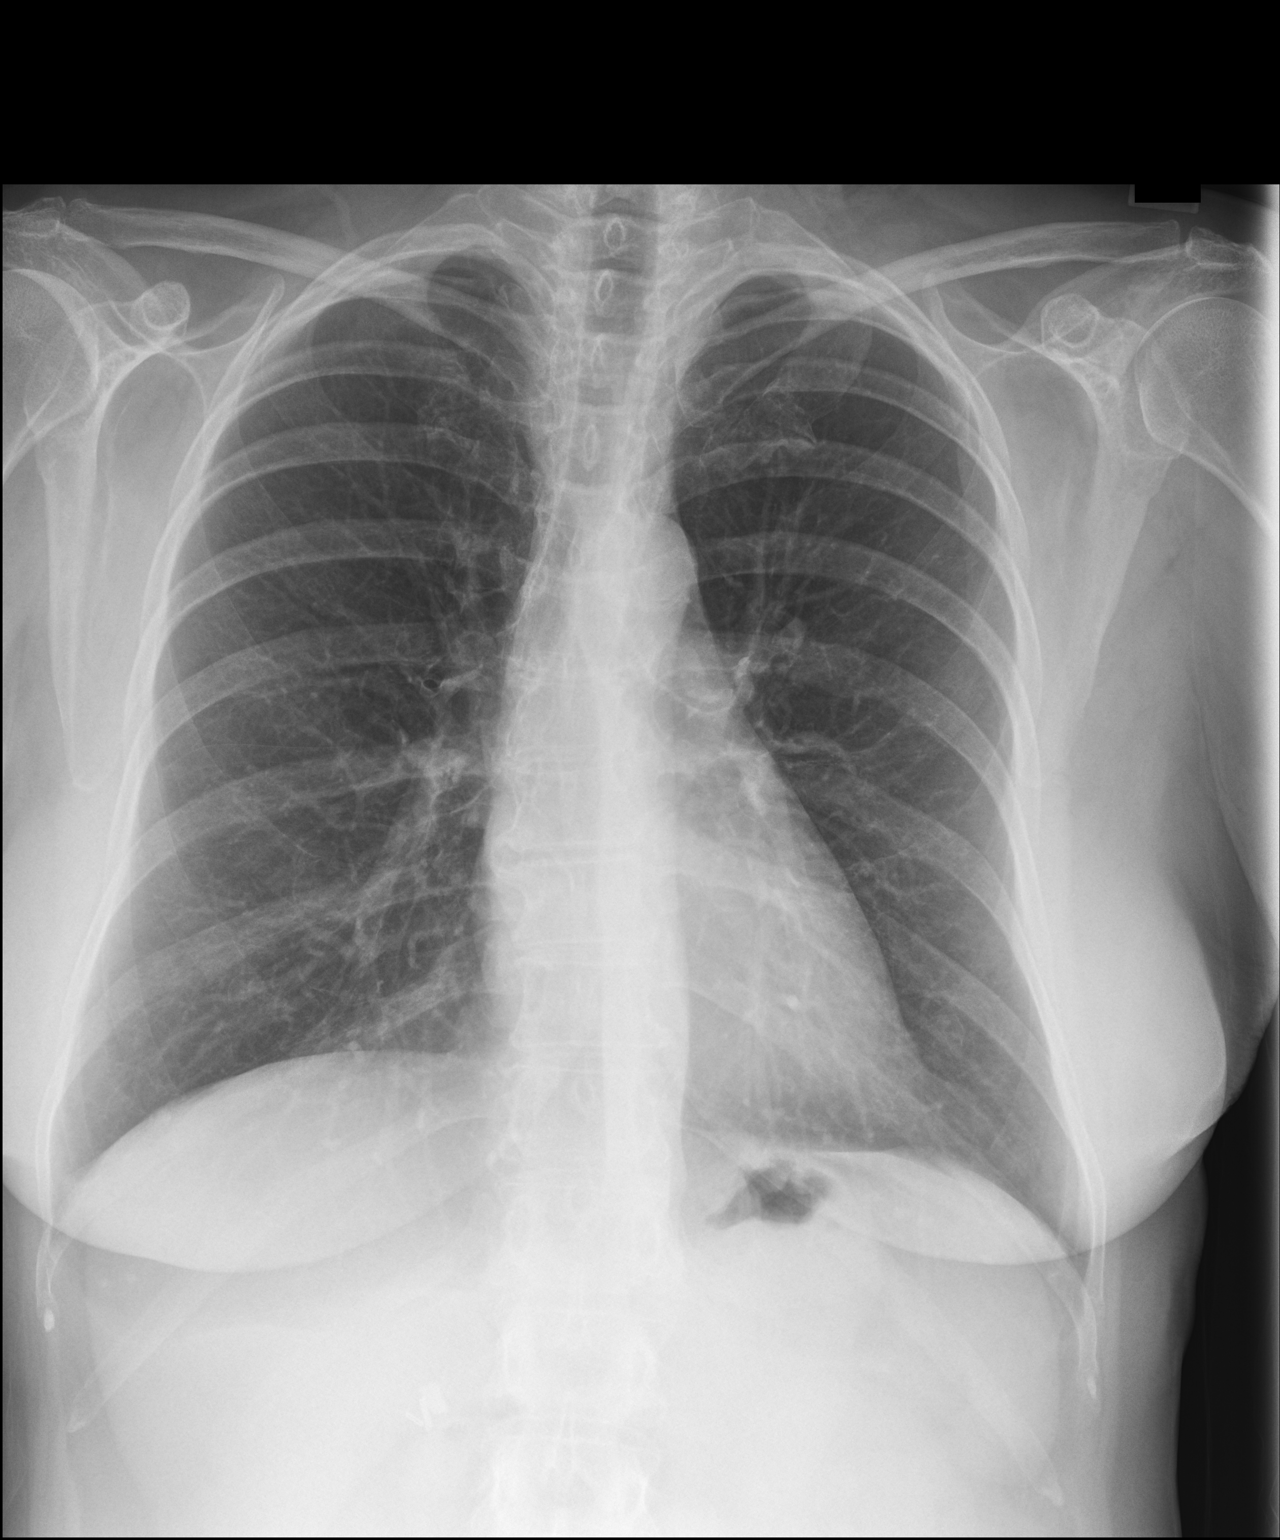

[lateral]
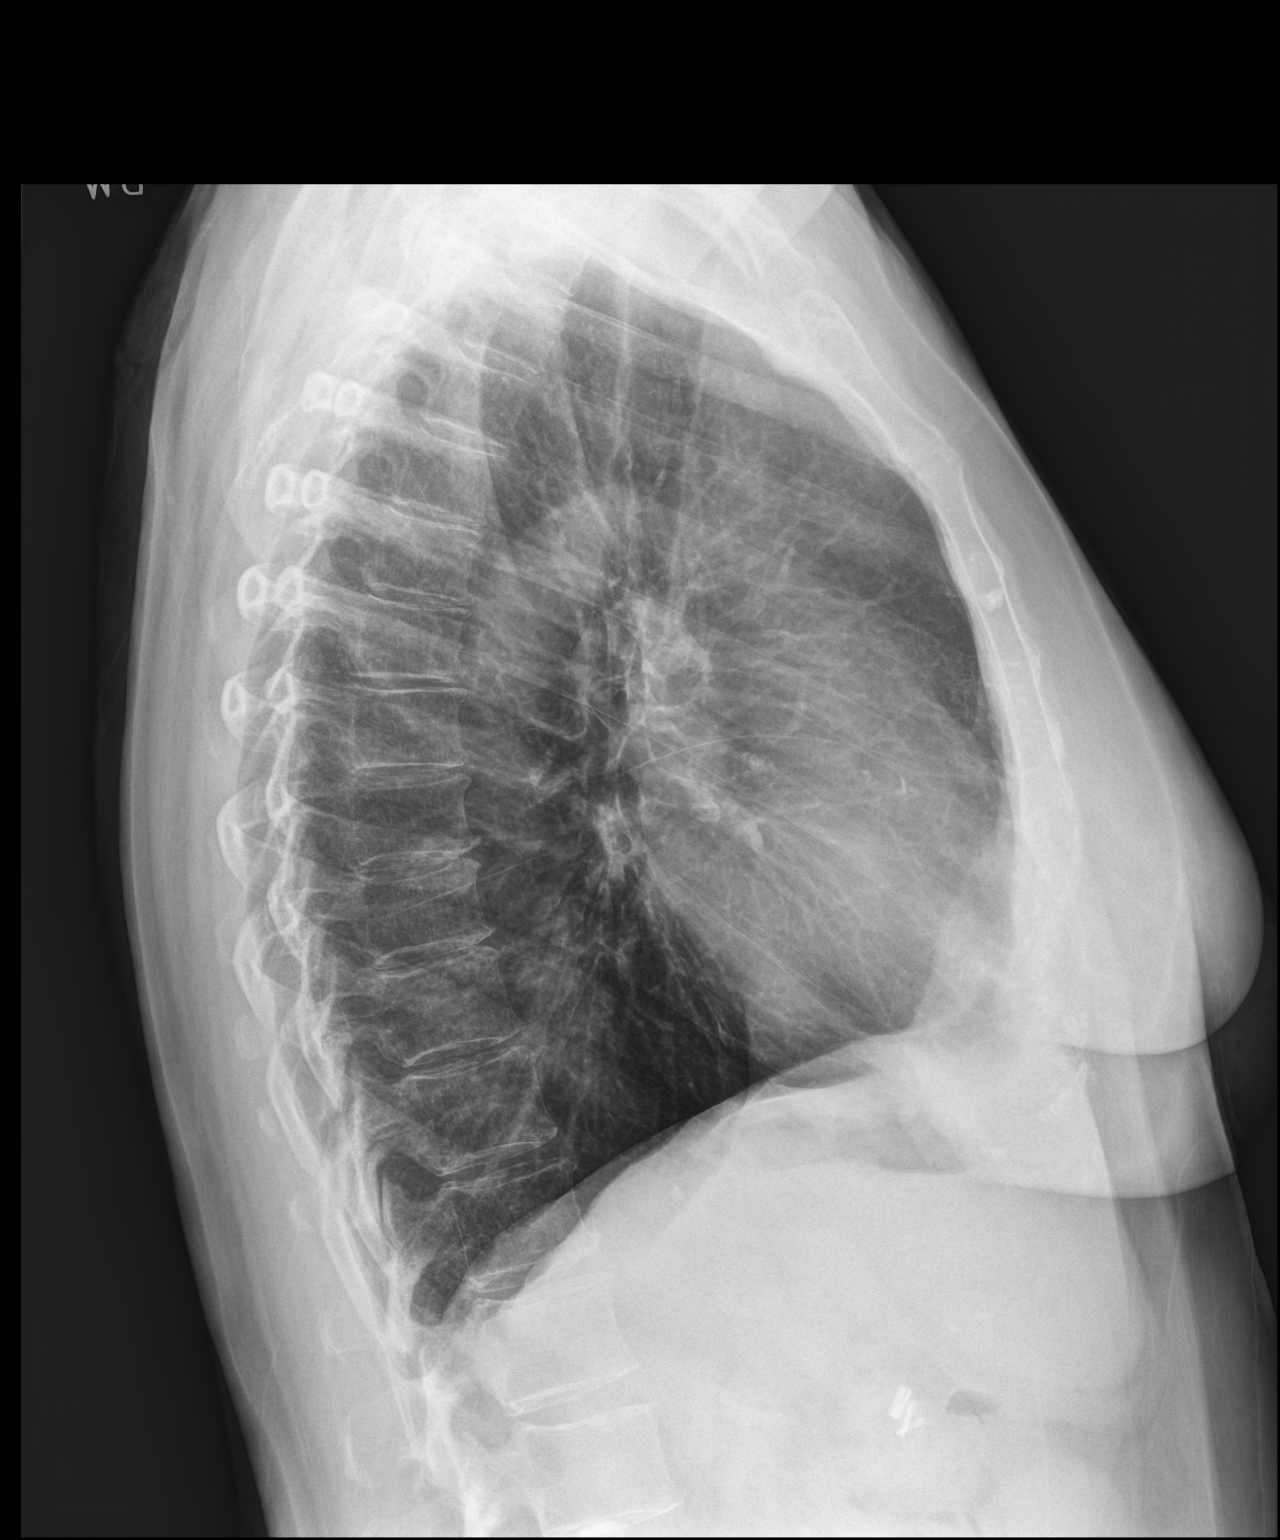

[2 of 2 positions shown; findings below may reference images not displayed]

FINDINGS: Lungs are clear. No consolidation. No effusion. Normal cardiac size 
and pulmonary vascularity. No pneumothorax. No acute osseous abnormality. Mild 
degenerative change and osteopenia. Cholecystectomy clips.
IMPRESSION: No acute cardiopulmonary findings.

## 2023-05-19 IMAGING — DX CHEST PA AND LATERAL
2 series · 2 of 2 positions shown · non-contrast
Comparison: April 29, 2023

________________________________________________________________________________________________ 
CLINICAL INDICATION: Cough, Unspecified.

[PA]
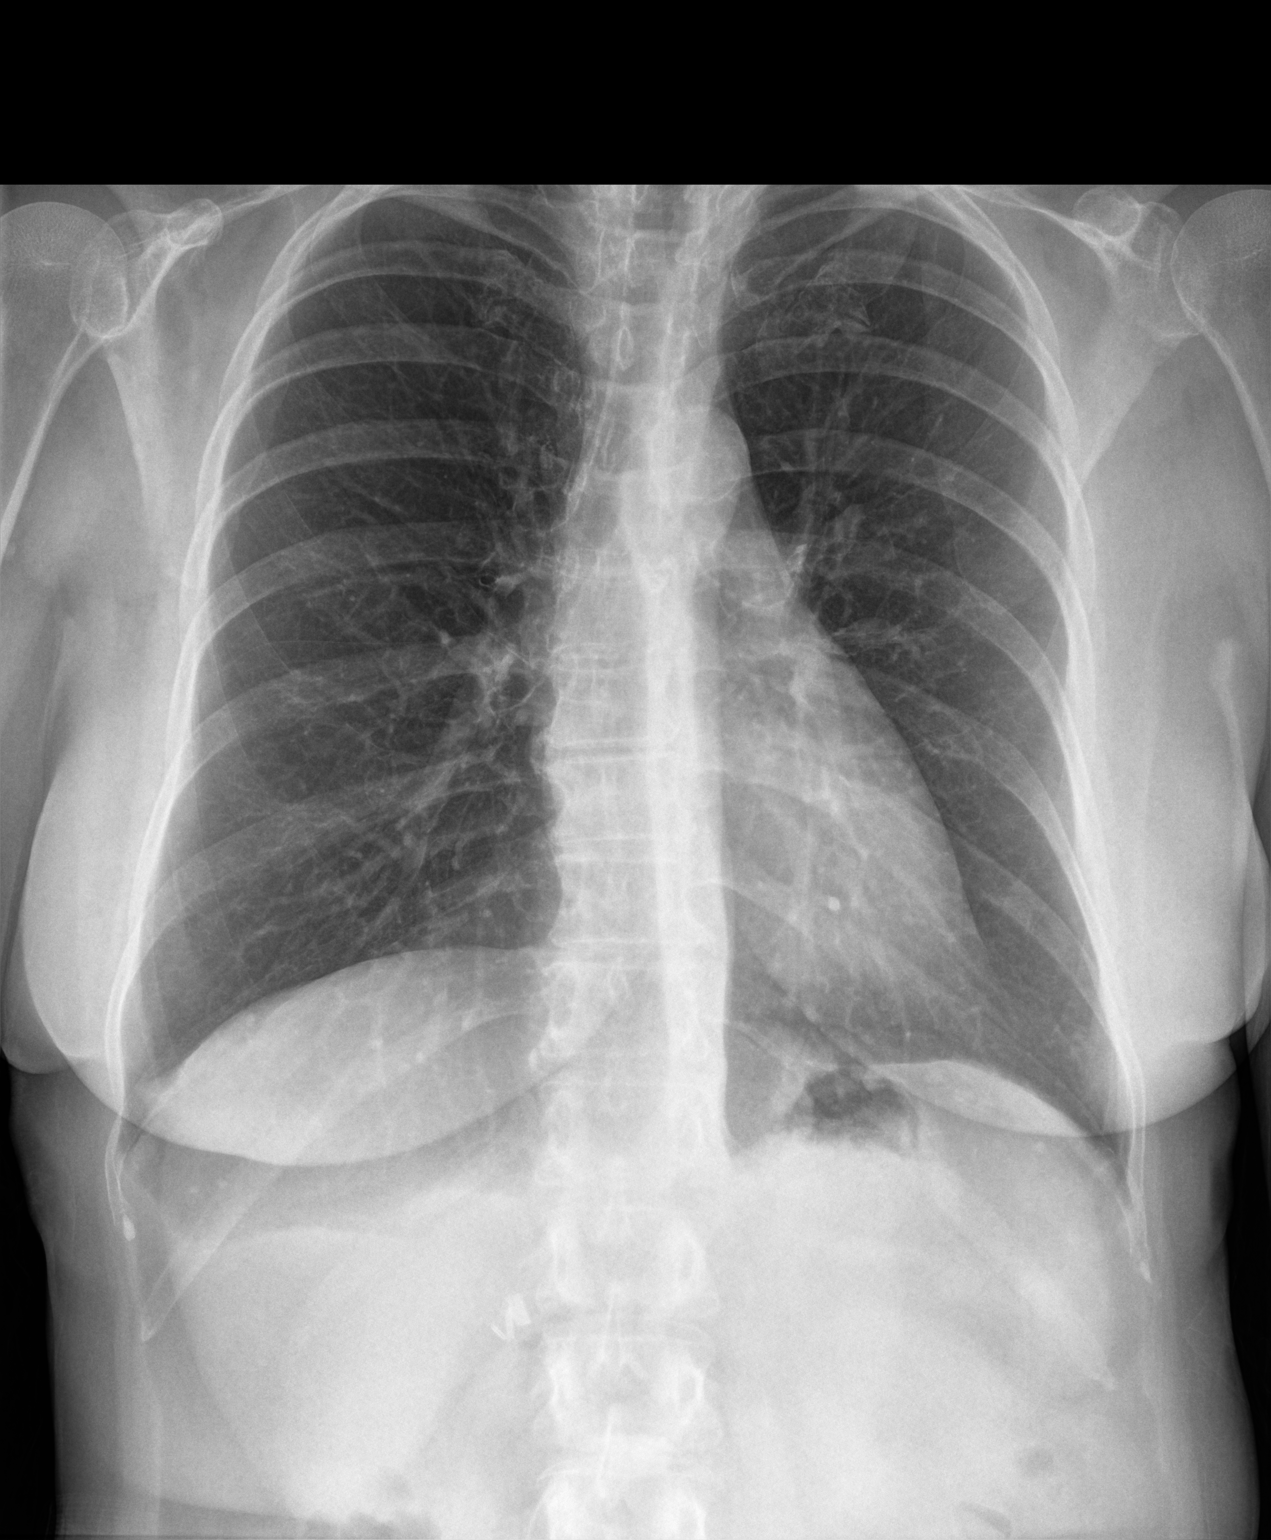

[lateral]
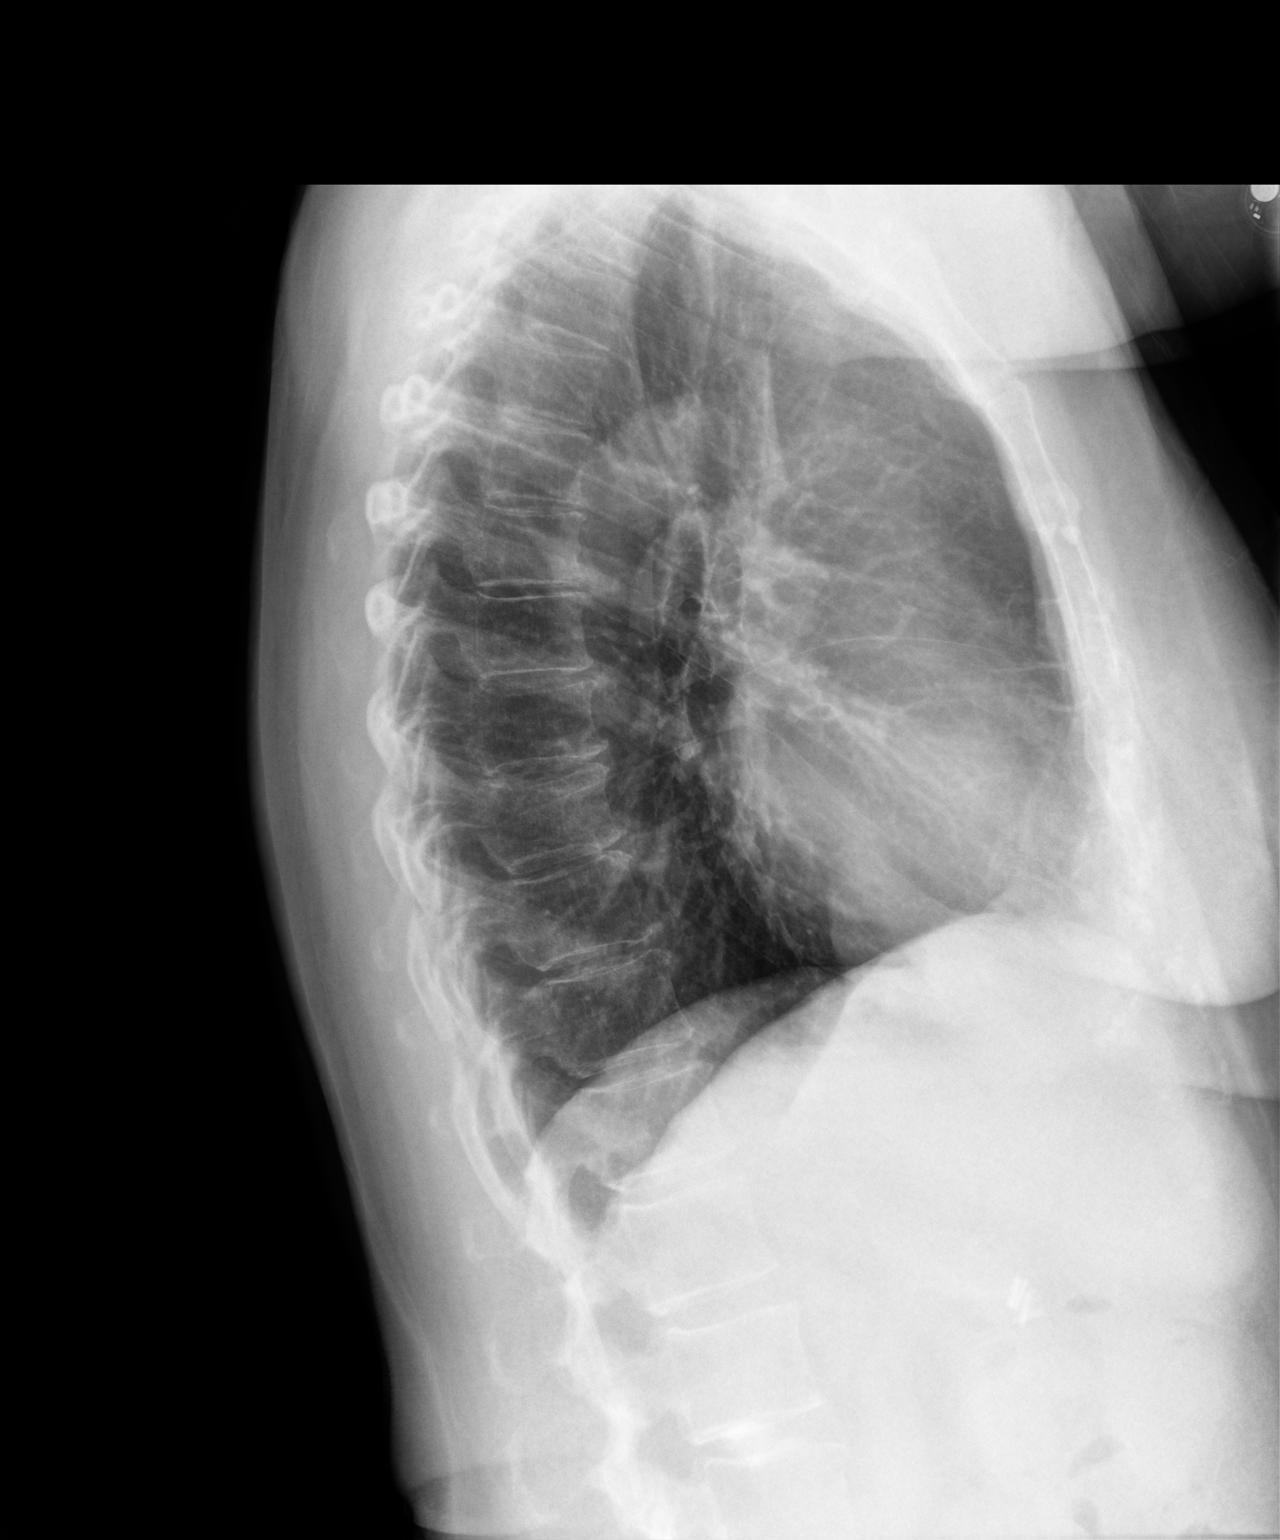

[2 of 2 positions shown; findings below may reference images not displayed]

FINDINGS: Atherosclerotic changes involving the thoracic arch. Postop changes 
right upper quadrant. Degenerative changes thoracic spine. No mass or 
consolidation. No pleural effusion seen.
IMPRESSION: No acute cardiopulmonary findings.
